# Patient Record
Sex: Female | Born: 1980 | Race: White | Hispanic: No | Marital: Married | Smoking: Never smoker
Health system: Southern US, Community
[De-identification: ages and names within clinical notes are randomized; demographics above are authoritative.]

## PROBLEM LIST (undated history)

## (undated) DIAGNOSIS — H8109 Meniere's disease, unspecified ear: Secondary | ICD-10-CM

## (undated) HISTORY — PX: CARPAL TUNNEL RELEASE: SHX101

## (undated) HISTORY — PX: ROTATOR CUFF REPAIR: SHX139

## (undated) HISTORY — PX: ANKLE SURGERY: SHX546

---

## 2019-02-05 DIAGNOSIS — H8109 Meniere's disease, unspecified ear: Secondary | ICD-10-CM | POA: Insufficient documentation

## 2020-03-07 DIAGNOSIS — F419 Anxiety disorder, unspecified: Secondary | ICD-10-CM | POA: Insufficient documentation

## 2020-10-31 DIAGNOSIS — J302 Other seasonal allergic rhinitis: Secondary | ICD-10-CM | POA: Insufficient documentation

## 2020-12-02 ENCOUNTER — Other Ambulatory Visit: Payer: Self-pay

## 2020-12-02 DIAGNOSIS — R197 Diarrhea, unspecified: Secondary | ICD-10-CM | POA: Insufficient documentation

## 2020-12-02 DIAGNOSIS — R1031 Right lower quadrant pain: Secondary | ICD-10-CM | POA: Insufficient documentation

## 2020-12-02 NOTE — ED Triage Notes (Signed)
Pt reports intermittent lower abdominal pain x2 days and diarrhea that started today. Pt denies N/V. Pt reports pain has become progressively worse.

## 2020-12-03 ENCOUNTER — Other Ambulatory Visit: Payer: Self-pay

## 2020-12-03 ENCOUNTER — Encounter (HOSPITAL_COMMUNITY): Payer: Self-pay

## 2020-12-03 ENCOUNTER — Emergency Department (HOSPITAL_COMMUNITY)
Admission: EM | Admit: 2020-12-03 | Discharge: 2020-12-03 | Disposition: A | Discharge status: Home / Self Care | Payer: BC Managed Care – PPO | Attending: Emergency Medicine | Admitting: Emergency Medicine

## 2020-12-03 ENCOUNTER — Emergency Department (HOSPITAL_COMMUNITY): Payer: BC Managed Care – PPO

## 2020-12-03 DIAGNOSIS — R1031 Right lower quadrant pain: Secondary | ICD-10-CM

## 2020-12-03 DIAGNOSIS — R197 Diarrhea, unspecified: Secondary | ICD-10-CM

## 2020-12-03 HISTORY — DX: Meniere's disease, unspecified ear: H81.09

## 2020-12-03 LAB — CBC
HCT: 39.7 % (ref 36.0–46.0)
Hemoglobin: 13 g/dL (ref 12.0–15.0)
MCH: 27.6 pg (ref 26.0–34.0)
MCHC: 32.7 g/dL (ref 30.0–36.0)
MCV: 84.3 fL (ref 80.0–100.0)
Platelets: 219 10*3/uL (ref 150–400)
RBC: 4.71 MIL/uL (ref 3.87–5.11)
RDW: 12.1 % (ref 11.5–15.5)
WBC: 4.7 10*3/uL (ref 4.0–10.5)
nRBC: 0 % (ref 0.0–0.2)

## 2020-12-03 LAB — URINALYSIS, ROUTINE W REFLEX MICROSCOPIC
Bilirubin Urine: NEGATIVE
Glucose, UA: NEGATIVE mg/dL
Ketones, ur: NEGATIVE mg/dL
Leukocytes,Ua: NEGATIVE
Nitrite: NEGATIVE
Protein, ur: NEGATIVE mg/dL
Specific Gravity, Urine: 1.017 (ref 1.005–1.030)
pH: 6 (ref 5.0–8.0)

## 2020-12-03 LAB — COMPREHENSIVE METABOLIC PANEL
ALT: 25 U/L (ref 0–44)
AST: 40 U/L (ref 15–41)
Albumin: 4 g/dL (ref 3.5–5.0)
Alkaline Phosphatase: 65 U/L (ref 38–126)
Anion gap: 10 (ref 5–15)
BUN: 13 mg/dL (ref 6–20)
CO2: 21 mmol/L — ABNORMAL LOW (ref 22–32)
Calcium: 8.6 mg/dL — ABNORMAL LOW (ref 8.9–10.3)
Chloride: 103 mmol/L (ref 98–111)
Creatinine, Ser: 1.01 mg/dL — ABNORMAL HIGH (ref 0.44–1.00)
GFR, Estimated: >60 mL/min (ref >60)
Glucose, Bld: 111 mg/dL — ABNORMAL HIGH (ref 70–99)
Potassium: 5 mmol/L (ref 3.5–5.1)
Sodium: 134 mmol/L — ABNORMAL LOW (ref 135–145)
Total Bilirubin: 0.9 mg/dL (ref 0.3–1.2)
Total Protein: 7.5 g/dL (ref 6.5–8.1)

## 2020-12-03 LAB — LIPASE, BLOOD: Lipase: 34 U/L (ref 11–51)

## 2020-12-03 LAB — I-STAT BETA HCG BLOOD, ED (MC, WL, AP ONLY): I-stat hCG, quantitative: <5 m[IU]/mL (ref <5)

## 2020-12-03 MED ORDER — IOHEXOL 300 MG/ML  SOLN
100.0000 mL | Freq: Once | INTRAMUSCULAR | Status: AC | PRN
  Administered 2020-12-03: 100 mL via INTRAVENOUS

## 2020-12-03 MED ORDER — SODIUM CHLORIDE (PF) 0.9 % IJ SOLN
INTRAMUSCULAR | Status: AC
  Filled 2020-12-03: qty 50

## 2020-12-03 MED ORDER — ONDANSETRON HCL 4 MG/2ML IJ SOLN
4.0000 mg | Freq: Once | INTRAMUSCULAR | Status: AC
  Administered 2020-12-03: 4 mg via INTRAVENOUS
  Filled 2020-12-03: qty 2

## 2020-12-03 MED ORDER — HYDROMORPHONE HCL 1 MG/ML IJ SOLN
0.5000 mg | Freq: Once | INTRAMUSCULAR | Status: AC
  Administered 2020-12-03: 0.5 mg via INTRAVENOUS
  Filled 2020-12-03: qty 1

## 2020-12-03 NOTE — Discharge Instructions (Addendum)
Please follow up with your doctor for recheck if symptoms persist through the weekend.   Return to the emergency department with new or worsening symptoms. Zofran for nausea as needed. Tylenol and/or ibuprofen for pain.

## 2020-12-03 NOTE — ED Provider Notes (Signed)
[facility]-EMERGENCY DEPT Provider Note   CSN: [id number] Arrival date & time: 12/02/20  2343     History Chief Complaint  Patient presents with  . Abdominal Pain    Diana Macdonald is a 39 y.o. female.  Patient with progressively worsening abdominal pain in the RLQ x 2 days. No fever, nausea or vomiting. No urinary symptoms. She reports the pain became significantly worse tonight prompting ED evaluation. No vaginal bleeding, discharge or irregular periods. No history of ovarian cysts.   The history is provided by the patient. No language interpreter was used.  Abdominal Pain Pain location:  RLQ Pain radiates to:  Does not radiate Pain severity:  Severe Onset quality:  Gradual Duration:  2 days Timing:  Constant Progression:  Worsening Chronicity:  New Relieved by:  Nothing Worsened by:  Nothing Associated symptoms: diarrhea   Associated symptoms: no chills, no dysuria, no fever, no nausea, no vaginal bleeding and no vomiting        Past Medical History:  Diagnosis Date  . Meniere disease     There are no problems to display for this patient.   History reviewed. No pertinent surgical history.   OB History   No obstetric history on file.     History reviewed. No pertinent family history.  Social History   Tobacco Use  . Smoking status: Not on file  Substance Use Topics  . Alcohol use: Not on file  . Drug use: Not on file    Home Medications Prior to Admission medications   Medication Sig Start Date End Date Taking? Authorizing Provider  cetirizine (ZYRTEC) 10 MG tablet Take 1 tablet by mouth daily as needed.   Yes [provider]  diazepam (VALIUM) 2 MG tablet Take 2 mg by mouth 2 (two) times daily as needed. 06/10/20  Yes [provider]  escitalopram (LEXAPRO) 10 MG tablet Take 10 mg by mouth daily. 10/20/20  Yes [provider]  Olopatadine HCl 0.2 % SOLN Place 1 drop into both eyes daily.    Yes [provider]    Allergies    Codeine and Gramineae pollens  Review of Systems   Review of Systems  Constitutional: Negative for chills and fever.  HENT: Negative.   Respiratory: Negative.   Cardiovascular: Negative.   Gastrointestinal: Positive for abdominal pain and diarrhea. Negative for nausea and vomiting.  Genitourinary: Negative for dysuria, flank pain, pelvic pain and vaginal bleeding.  Musculoskeletal: Negative.   Skin: Negative.   Neurological: Negative.     Physical Exam Updated Vital Signs BP 105/69   Pulse 84   Temp 98.5 F (36.9 C) (Oral)   Resp 16   Ht 5\' 4"  (1.626 m)   Wt 79.4 kg   LMP 11/25/2020 (Exact Date)   SpO2 96%   BMI 30.04 kg/m   Physical Exam Vitals and nursing note reviewed.  Constitutional:      General: She is not in acute distress.    Appearance: She is well-developed.  HENT:     Head: Normocephalic.  Cardiovascular:     Rate and Rhythm: Normal rate and regular rhythm.  Pulmonary:     Effort: Pulmonary effort is normal.     Breath sounds: Normal breath sounds.  Abdominal:     General: Bowel sounds are normal.     Palpations: Abdomen is soft.     Tenderness: There is abdominal tenderness in the right lower quadrant. There is rebound. There is no guarding.  Musculoskeletal:        General: Normal range of motion.     Cervical back: Normal range of motion and neck supple.  Skin:    General: Skin is warm and dry.     Findings: No rash.  Neurological:     Mental Status: She is alert.     Cranial Nerves: No cranial nerve deficit.     ED Results / Procedures / Treatments   Labs (all labs ordered are listed, but only abnormal results are displayed) Labs Reviewed  COMPREHENSIVE METABOLIC PANEL - Abnormal; Notable for the following components:      Result Value   Sodium 134 (*)    CO2 21 (*)    Glucose, Bld 111 (*)    Creatinine, Ser 1.01 (*)    Calcium 8.6 (*)    All other components within normal limits   URINALYSIS, ROUTINE W REFLEX MICROSCOPIC - Abnormal; Notable for the following components:   Hgb urine dipstick MODERATE (*)    Bacteria, UA RARE (*)    All other components within normal limits  LIPASE, BLOOD  CBC  I-STAT BETA HCG BLOOD, ED ([facility], [facility], [facility] ONLY)   Results for orders placed or performed during the hospital encounter of 12/03/20  Lipase, blood  Result Value Ref Range   Lipase 34 11 - 51 U/L  Comprehensive metabolic panel  Result Value Ref Range   Sodium 134 (L) 135 - 145 mmol/L   Potassium 5.0 3.5 - 5.1 mmol/L   Chloride 103 98 - 111 mmol/L   CO2 21 (L) 22 - 32 mmol/L   Glucose, Bld 111 (H) 70 - 99 mg/dL   BUN 13 6 - 20 mg/dL   Creatinine, Ser 2.99 (H) 0.44 - 1.00 mg/dL   Calcium 8.6 (L) 8.9 - 10.3 mg/dL   Total Protein 7.5 6.5 - 8.1 g/dL   Albumin 4.0 3.5 - 5.0 g/dL   AST 40 15 - 41 U/L   ALT 25 0 - 44 U/L   Alkaline Phosphatase 65 38 - 126 U/L   Total Bilirubin 0.9 0.3 - 1.2 mg/dL   GFR, Estimated >37 >16 mL/min   Anion gap 10 5 - 15  CBC  Result Value Ref Range   WBC 4.7 4.0 - 10.5 K/uL   RBC 4.71 3.87 - 5.11 MIL/uL   Hemoglobin 13.0 12.0 - 15.0 g/dL   HCT 96.7 36 - 46 %   MCV 84.3 80.0 - 100.0 fL   MCH 27.6 26.0 - 34.0 pg   MCHC 32.7 30.0 - 36.0 g/dL   RDW 89.3 81.0 - 17.5 %   Platelets 219 150 - 400 K/uL   nRBC 0.0 0.0 - 0.2 %  Urinalysis, Routine w reflex microscopic Urine, Catheterized  Result Value Ref Range   Color, Urine YELLOW YELLOW   APPearance CLEAR CLEAR   Specific Gravity, Urine 1.017 1.005 - 1.030   pH 6.0 5.0 - 8.0   Glucose, UA NEGATIVE NEGATIVE mg/dL   Hgb urine dipstick MODERATE (A) NEGATIVE   Bilirubin Urine NEGATIVE NEGATIVE   Ketones, ur NEGATIVE NEGATIVE mg/dL   Protein, ur NEGATIVE NEGATIVE mg/dL   Nitrite NEGATIVE NEGATIVE   Leukocytes,Ua NEGATIVE NEGATIVE   RBC / HPF 0-5 0 - 5 RBC/hpf   WBC, UA 0-5 0 - 5 WBC/hpf   Bacteria, UA RARE (A) NONE SEEN   Squamous Epithelial / LPF 0-5 0 - 5   Mucus PRESENT   I-Stat beta hCG  blood, ED  Result  Value Ref Range   I-stat hCG, quantitative <5.0 <5 mIU/mL   Comment 3           EKG None  Radiology CT ABDOMEN PELVIS W CONTRAST  Addendum Date: 12/03/2020   ADDENDUM REPORT: 12/03/2020 04:51 ADDENDUM: Clustered 3-4 mm nodules in the right middle lobe. No follow-up needed if patient is low-risk (and has no known or suspected primary neoplasm). Non-contrast chest CT can be considered in 12 months if patient is high-risk. This recommendation follows the consensus statement: Guidelines for Management of Incidental Pulmonary Nodules Detected on CT Images: From the Fleischner Society 2017; Radiology 2017; 284:228-243. Electronically Signed   By: Deatra RobinsonKevin  Herman M.D.   On: 12/03/2020 04:51   Result Date: 12/03/2020 CLINICAL DATA:  Right lower quadrant pain EXAM: CT ABDOMEN AND PELVIS WITH CONTRAST TECHNIQUE: Multidetector CT imaging of the abdomen and pelvis was performed using the standard protocol following bolus administration of intravenous contrast. CONTRAST:  100mL OMNIPAQUE IOHEXOL 300 MG/ML  SOLN COMPARISON:  None. FINDINGS: LOWER CHEST: Normal. HEPATOBILIARY: Normal hepatic contours. No intra- or extrahepatic biliary dilatation. The gallbladder is normal. PANCREAS: Normal pancreas. No ductal dilatation or peripancreatic fluid collection. SPLEEN: Normal. ADRENALS/URINARY TRACT: The adrenal glands are normal. No hydronephrosis, nephroureterolithiasis or solid renal mass. The urinary bladder is normal for degree of distention STOMACH/BOWEL: There is no hiatal hernia. Normal duodenal course and caliber. No small bowel dilatation or inflammation. No focal colonic abnormality. Normal appendix. VASCULAR/LYMPHATIC: Normal course and caliber of the major abdominal vessels. No abdominal or pelvic lymphadenopathy. REPRODUCTIVE: Normal uterus. No adnexal mass. MUSCULOSKELETAL. No bony spinal canal stenosis or focal osseous abnormality. OTHER: None. IMPRESSION: No acute abnormality of the abdomen  or pelvis. Electronically Signed: By: Deatra RobinsonKevin  Herman M.D. On: 12/03/2020 04:44    Procedures Procedures (including critical care time)  Medications Ordered in ED Medications  sodium chloride (PF) 0.9 % injection (has no administration in time range)  HYDROmorphone (DILAUDID) injection 0.5 mg (0.5 mg Intravenous Given 12/03/20 0345)  ondansetron (ZOFRAN) injection 4 mg (4 mg Intravenous Given 12/03/20 0410)  iohexol (OMNIPAQUE) 300 MG/ML solution 100 mL (100 mLs Intravenous Contrast Given 12/03/20 0416)    ED Course  I have reviewed the triage vital signs and the nursing notes.  Pertinent labs & imaging results that were available during my care of the patient were reviewed by me and considered in my medical decision making (see chart for details).    MDM Rules/Calculators/A&P                          Patient to ED for evaluation of abdominal pain as detailed in the HPI. Diarrhea but no vomiting. No fever.   Abdomen is tender in the RLQ with rebound. No fever, no leukocytosis, UA negative. Discussed a pelvic exam with the patient to evaluate right adnexal area and to determine the best imaging modality - CT vs pelvic US. The patient defers gynecologic exam and favors CT to evaluate RLQ abdominal pain.   CT visualizes a normal appendix, no adnexal masses. There is an incidental finding of RML lung nodules that was discussed with the patient, who is felt to be low risk.   Recheck and re-exam: The patient received 0.5 mg Dilaudid and reports her pain is significantly improved. She is minimally tender to palpation.   Case reviewed and discussed with Dr. Bebe ShaggyWickline. She can be discharged home. Recommended Zofran prn for nausea, Tylenol and/or ibuprofen for pain. Strict return  precautions discussed - specifically, severe pain, bloody stools, high fever, onset uncontrolled vomiting.   Final Clinical Impression(s) / ED Diagnoses Final diagnoses:  None   1. Abdominal pain 2. Diarrhea  Rx / DC  Orders ED Discharge Orders    None       Elpidio Anis, PA-C 12/03/20 1941    Zadie Rhine, MD 12/03/20 [phone]

## 2021-12-23 IMAGING — CT CT ABD-PELV W/ CM
2 of 4 series · 15 of 46 positions shown, 17 images · IV contrast (omnipaque)
Comparison: None.
COMPARISON: None.

Addendum:
CLINICAL DATA: Right lower quadrant pain

EXAM:
CT ABDOMEN AND PELVIS WITH CONTRAST
TECHNIQUE: Multidetector CT imaging of the abdomen and pelvis was performed
using the standard protocol following bolus administration of
intravenous contrast.
CONTRAST:  100mL OMNIPAQUE IOHEXOL 300 MG/ML  SOLN

[Series 2: axial st · axial · 0.76mm/px · z∈[-520,-90]mm · 12 of 98 slices shown, 14 images]
[im 6/98  soft-tissue]
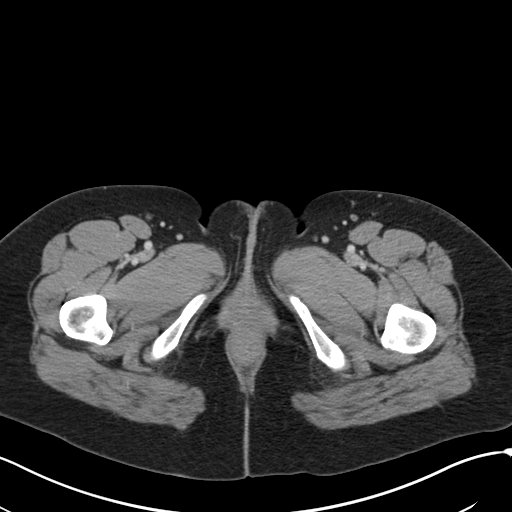
[im 6/98  bone]
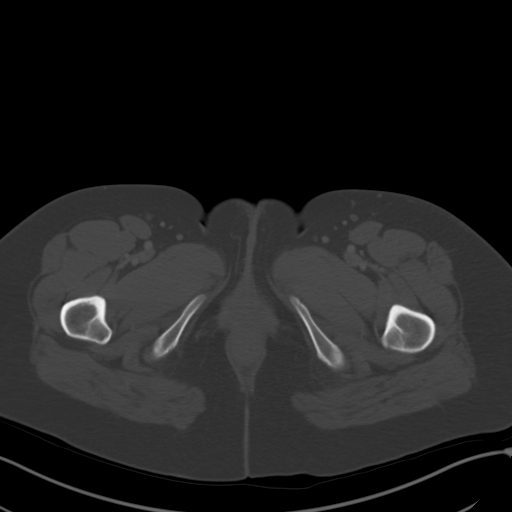
[im 16/98  soft-tissue]
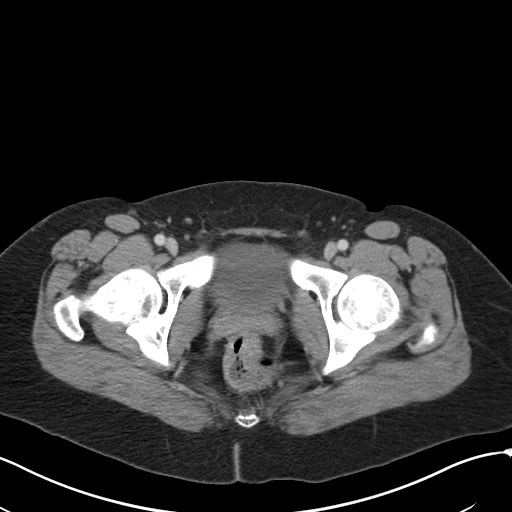
[im 21/98  soft-tissue]
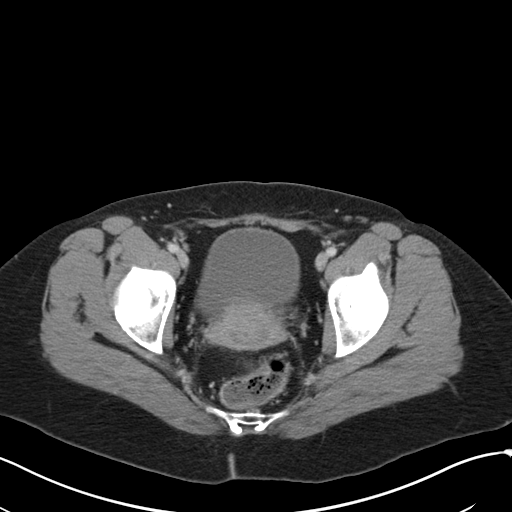
[im 31/98  soft-tissue]
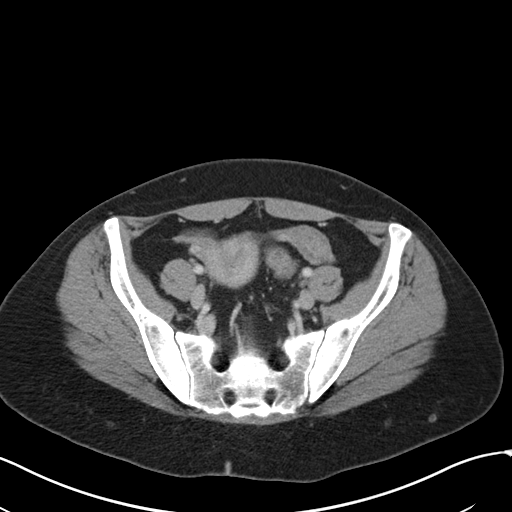
[im 36/98  soft-tissue]
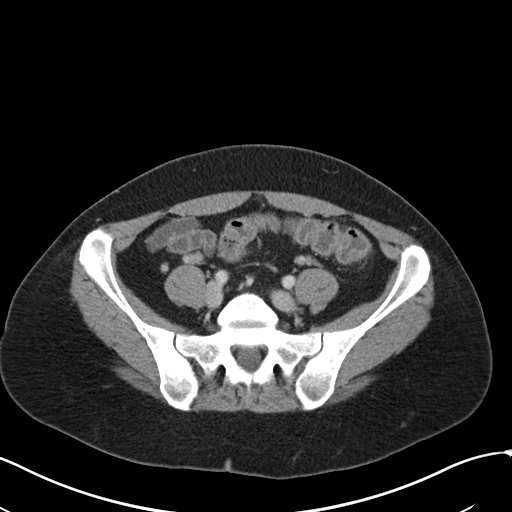
[im 46/98  soft-tissue]
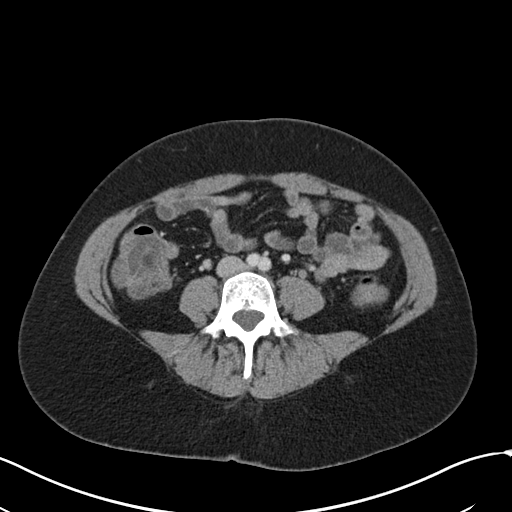
[im 52/98  soft-tissue]
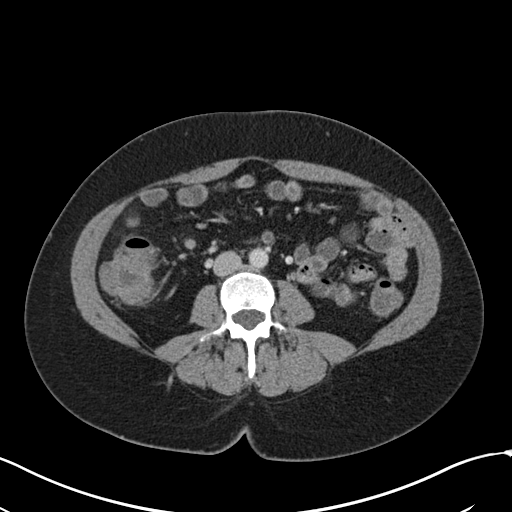
[im 62/98  soft-tissue]
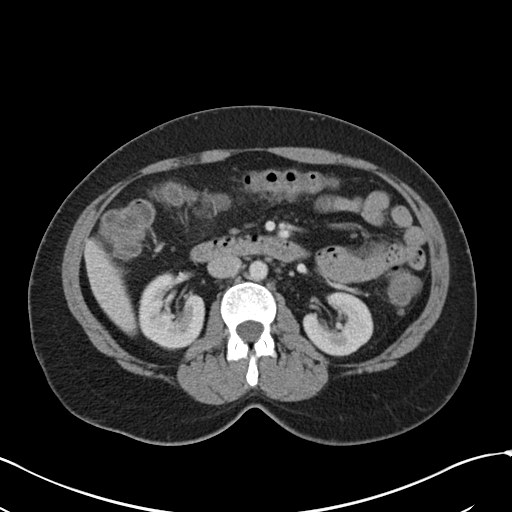
[im 67/98  soft-tissue]
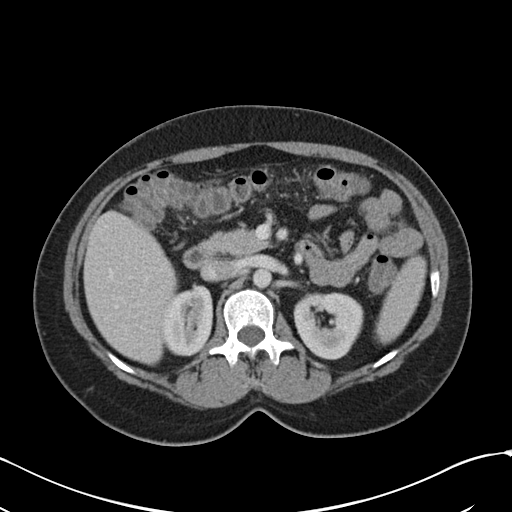
[im 67/98  bone]
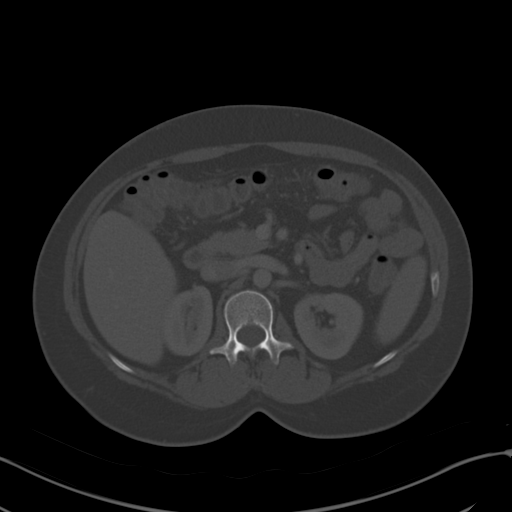
[im 77/98  soft-tissue]
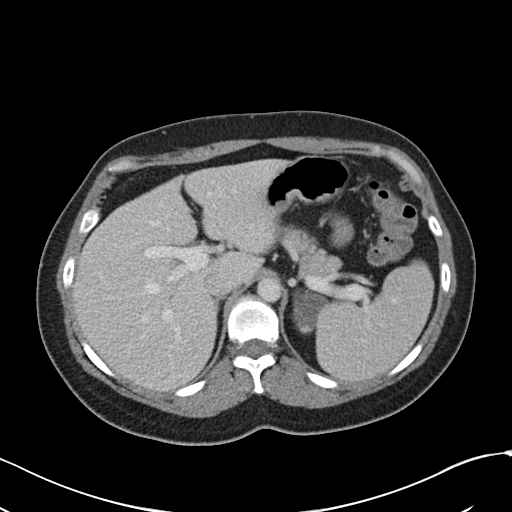
[im 82/98  soft-tissue]
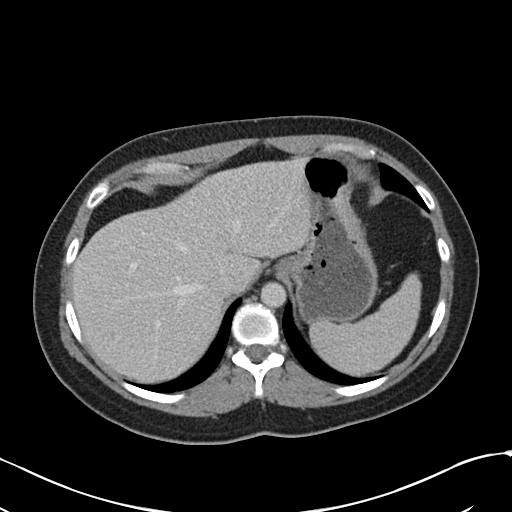
[im 92/98  soft-tissue]
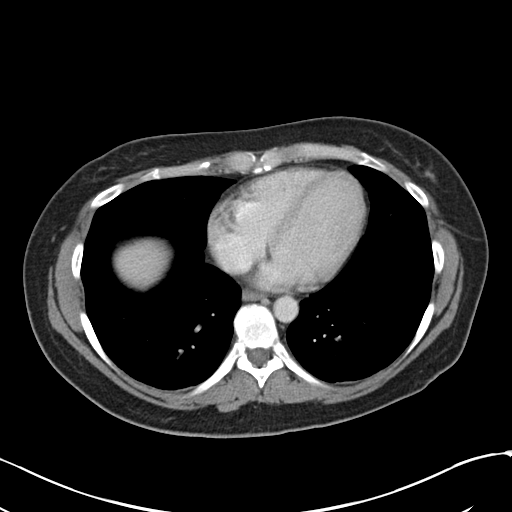

[Series 5: coronal st · coronal · 0.77mm/px · 3 of 151 slices shown]
[im 51/151  soft-tissue]
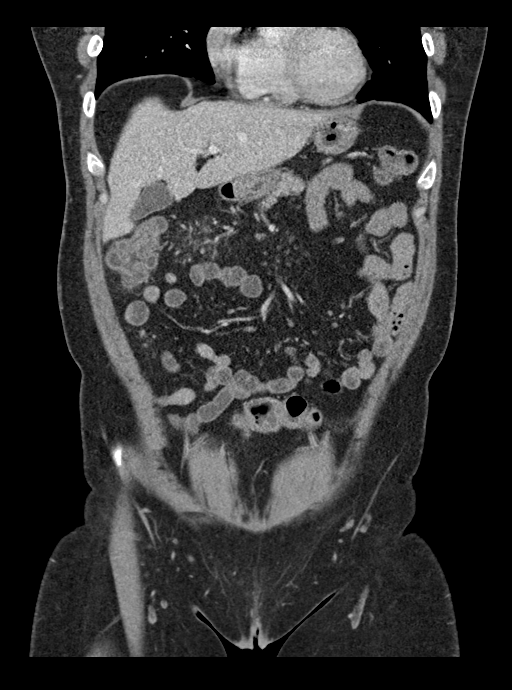
[im 67/151  soft-tissue]
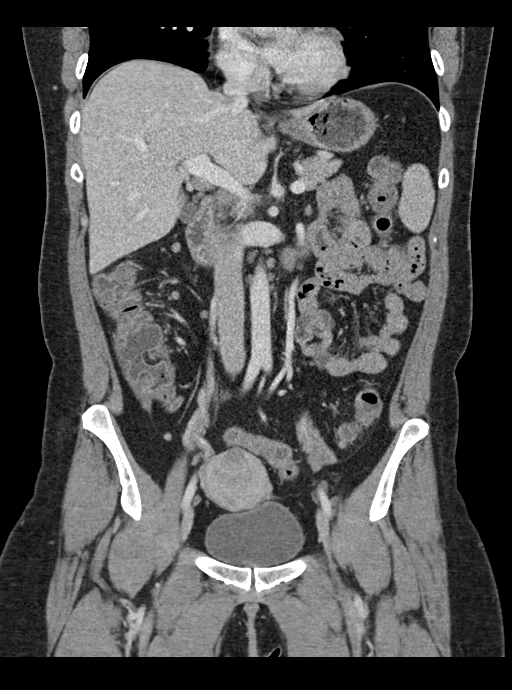
[im 84/151  soft-tissue]
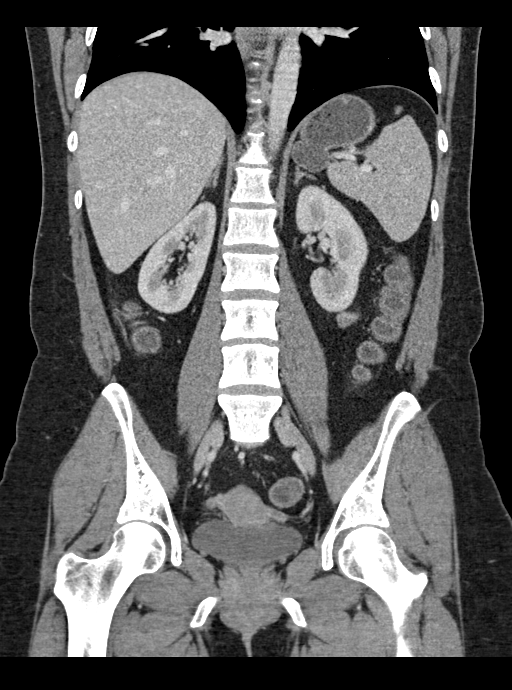

[15 of 46 positions shown; findings below may reference images not displayed]

FINDINGS: LOWER CHEST: Normal.

HEPATOBILIARY: Normal hepatic contours. No intra- or extrahepatic
biliary dilatation. The gallbladder is normal.

PANCREAS: Normal pancreas. No ductal dilatation or peripancreatic
fluid collection.

SPLEEN: Normal.

ADRENALS/URINARY TRACT: The adrenal glands are normal. No
hydronephrosis, nephroureterolithiasis or solid renal mass. The
urinary bladder is normal for degree of distention

STOMACH/BOWEL: There is no hiatal hernia. Normal duodenal course and
caliber. No small bowel dilatation or inflammation. No focal colonic
abnormality. Normal appendix.

VASCULAR/LYMPHATIC: Normal course and caliber of the major abdominal
vessels. No abdominal or pelvic lymphadenopathy.

REPRODUCTIVE: Normal uterus. No adnexal mass.

MUSCULOSKELETAL. No bony spinal canal stenosis or focal osseous
abnormality.

OTHER: None.
IMPRESSION: No acute abnormality of the abdomen or pelvis.

ADDENDUM:
Clustered 3-4 mm nodules in the right middle lobe. No follow-up
needed if patient is low-risk (and has no known or suspected primary
neoplasm). Non-contrast chest CT can be considered in 12 months if
patient is high-risk. This recommendation follows the consensus
statement: Guidelines for Management of Incidental Pulmonary Nodules
Detected on CT Images: From the [HOSPITAL] 3366; Radiology
3366; [DATE].

*** End of Addendum ***
FINDINGS: LOWER CHEST: Normal.

HEPATOBILIARY: Normal hepatic contours. No intra- or extrahepatic
biliary dilatation. The gallbladder is normal.

PANCREAS: Normal pancreas. No ductal dilatation or peripancreatic
fluid collection.

SPLEEN: Normal.

ADRENALS/URINARY TRACT: The adrenal glands are normal. No
hydronephrosis, nephroureterolithiasis or solid renal mass. The
urinary bladder is normal for degree of distention

STOMACH/BOWEL: There is no hiatal hernia. Normal duodenal course and
caliber. No small bowel dilatation or inflammation. No focal colonic
abnormality. Normal appendix.

VASCULAR/LYMPHATIC: Normal course and caliber of the major abdominal
vessels. No abdominal or pelvic lymphadenopathy.

REPRODUCTIVE: Normal uterus. No adnexal mass.

MUSCULOSKELETAL. No bony spinal canal stenosis or focal osseous
abnormality.

OTHER: None.
IMPRESSION: No acute abnormality of the abdomen or pelvis.

## 2022-06-07 ENCOUNTER — Encounter (HOSPITAL_BASED_OUTPATIENT_CLINIC_OR_DEPARTMENT_OTHER): Payer: Self-pay

## 2022-06-07 ENCOUNTER — Other Ambulatory Visit: Payer: Self-pay

## 2022-06-07 ENCOUNTER — Emergency Department (HOSPITAL_BASED_OUTPATIENT_CLINIC_OR_DEPARTMENT_OTHER)
Admission: EM | Admit: 2022-06-07 | Discharge: 2022-06-07 | Disposition: A | Discharge status: Home / Self Care | Payer: Managed Care, Other (non HMO) | Attending: Emergency Medicine | Admitting: Emergency Medicine

## 2022-06-07 DIAGNOSIS — Z20822 Contact with and (suspected) exposure to covid-19: Secondary | ICD-10-CM | POA: Diagnosis not present

## 2022-06-07 DIAGNOSIS — R509 Fever, unspecified: Secondary | ICD-10-CM | POA: Insufficient documentation

## 2022-06-07 LAB — SARS CORONAVIRUS 2 BY RT PCR: SARS Coronavirus 2 by RT PCR: NEGATIVE

## 2022-06-07 MED ORDER — NAPROXEN 500 MG PO TABS: 500.0000 mg | ORAL_TABLET | Freq: Two times a day (BID) | ORAL | 0 refills | Status: DC

## 2022-06-07 NOTE — Discharge Instructions (Addendum)
You were evaluated in the Emergency Department and after careful evaluation, we did not find any emergent condition requiring admission or further testing in the hospital.  Your exam/testing today is overall reassuring.  Suspect fevers due to a viral illness.  Can use the Naprosyn for body aches, fever as needed.  Recommend plenty of fluids and rest.  Follow-up on your COVID test tomorrow morning.  Please return to the Emergency Department if you experience any worsening of your condition.   Thank you for allowing [place] to be a part of your care.

## 2022-06-07 NOTE — ED Triage Notes (Signed)
Pt arrives POV with complaints of fever since yesterday afternoon. Pt states that she has been taking tylenol with some relief. Pt took Tylenol an hour and a half PTA. Denies pain at this time.

## 2022-06-07 NOTE — ED Provider Notes (Signed)
[facility] EMERGENCY [facility] Emergency Department Provider Note MRN:  [id number]  Arrival date & time: 06/07/22     Chief Complaint   Fever   History of Present Illness   Diana Macdonald is a 41 y.o. year-old female with no pertinent past medical history presenting to the ED with chief complaint of fever.  Fever for the past 24 hours, up to 102 at home.  Does not normally get fevers and so here for evaluation.  Feeling some body aches but otherwise no symptoms.  No rash, no tick bites, no chest pain or shortness of breath, no cough or cold-like symptoms, no vomiting or diarrhea, no abdominal pain.  Review of Systems  A thorough review of systems was obtained and all systems are negative except as noted in the HPI and PMH.   Patient's Health History    Past Medical History:  Diagnosis Date   Meniere disease     No past surgical history on file.  No family history on file.  Social History   Socioeconomic History   Marital status: Married    Spouse name: Not on file   Number of children: Not on file   Years of education: Not on file   Highest education level: Not on file  Occupational History   Not on file  Tobacco Use   Smoking status: Not on file   Smokeless tobacco: Not on file  Substance and Sexual Activity   Alcohol use: Not on file   Drug use: Not on file   Sexual activity: Not on file  Other Topics Concern   Not on file  Social History Narrative   Not on file   Social Determinants of Health   Financial Resource Strain: Not on file  Food Insecurity: Not on file  Transportation Needs: Not on file  Physical Activity: Not on file  Stress: Not on file  Social Connections: Not on file  Intimate Partner Violence: Not on file     Physical Exam   Vitals:   06/07/22 1922 06/07/22 2230  BP: (!) 135/99 117/79  Pulse: (!) 120 92  Resp: 18 16  Temp: 100.3 F (37.9 C) (!) 100.6 F (38.1 C)  SpO2: 97% 96%    CONSTITUTIONAL: Well-appearing,  NAD NEURO/PSYCH:  Alert and oriented x 3, no focal deficits EYES:  eyes equal and reactive ENT/NECK:  no LAD, no JVD CARDIO: Regular rate, well-perfused, normal S1 and S2 PULM:  CTAB no wheezing or rhonchi GI/GU:  non-distended, non-tender MSK/SPINE:  No gross deformities, no edema SKIN:  no rash, atraumatic   *Additional and/or pertinent findings included in MDM below  Diagnostic and Interventional Summary    EKG Interpretation  Date/Time:    Ventricular Rate:    PR Interval:    QRS Duration:   QT Interval:    QTC Calculation:   R Axis:     Text Interpretation:         Labs Reviewed  SARS CORONAVIRUS 2 BY RT PCR    No orders to display    Medications - No data to display   Procedures  /  Critical Care Procedures  ED Course and Medical Decision Making  Initial Impression and Ddx Fever of unclear source, suspect viral given patient's very well-appearing nature.  Normal vital signs, no meningismus, abdomen is soft and nontender, she has no pain.  Provided reassurance.  Past medical/surgical history that increases complexity of ED encounter: None  Interpretation of Diagnostics Not applicable  Patient Reassessment  and Ultimate Disposition/Management     Discharge  Patient management required discussion with the following services or consulting groups:  None  Complexity of Problems Addressed Acute complicated illness or Injury  Additional Data Reviewed and Analyzed Further history obtained from: None  Additional Factors Impacting ED Encounter Risk Prescriptions  Elmer Sow. Pilar Plate, MD [facility] Emergency Medicine [facility] [email or web address]  Final Clinical Impressions(s) / ED Diagnoses     ICD-10-CM   1. Fever, unspecified fever cause  R50.9       ED Discharge Orders          Ordered    naproxen (NAPROSYN) 500 MG tablet  2 times daily        06/07/22 2335             Discharge Instructions Discussed with and  Provided to Patient:    Discharge Instructions      You were evaluated in the Emergency Department and after careful evaluation, we did not find any emergent condition requiring admission or further testing in the hospital.  Your exam/testing today is overall reassuring.  Suspect fevers due to a viral illness.  Can use the Naprosyn for body aches, fever as needed.  Recommend plenty of fluids and rest.  Follow-up on your COVID test tomorrow morning.  Please return to the Emergency Department if you experience any worsening of your condition.   Thank you for allowing [place] to be a part of your care.      Sabas Sous, MD 06/07/22 2337

## 2022-08-30 DIAGNOSIS — G5603 Carpal tunnel syndrome, bilateral upper limbs: Secondary | ICD-10-CM | POA: Insufficient documentation

## 2024-08-17 LAB — LAB REPORT - SCANNED: EGFR: 82

## 2024-08-25 DIAGNOSIS — K8681 Exocrine pancreatic insufficiency: Secondary | ICD-10-CM | POA: Insufficient documentation

## 2024-09-11 ENCOUNTER — Other Ambulatory Visit: Payer: Self-pay | Admitting: Medical Genetics

## 2024-09-15 ENCOUNTER — Ambulatory Visit: Admitting: Student in an Organized Health Care Education/Training Program

## 2024-09-15 VITALS — BP 112/77 | HR 74 | Ht 64.0 in | Wt 170.2 lb

## 2024-09-15 DIAGNOSIS — L680 Hirsutism: Secondary | ICD-10-CM

## 2024-09-15 DIAGNOSIS — E538 Deficiency of other specified B group vitamins: Secondary | ICD-10-CM

## 2024-09-15 DIAGNOSIS — R634 Abnormal weight loss: Secondary | ICD-10-CM

## 2024-09-15 DIAGNOSIS — E611 Iron deficiency: Secondary | ICD-10-CM | POA: Diagnosis not present

## 2024-09-15 DIAGNOSIS — K8681 Exocrine pancreatic insufficiency: Secondary | ICD-10-CM

## 2024-09-15 LAB — CBC WITH DIFFERENTIAL/PLATELET
Basophils Absolute: 0 K/uL (ref 0.0–0.1)
Basophils Relative: 0.4 % (ref 0.0–3.0)
Eosinophils Absolute: 0.1 K/uL (ref 0.0–0.7)
Eosinophils Relative: 2 % (ref 0.0–5.0)
HCT: 40.3 % (ref 36.0–46.0)
Hemoglobin: 13.4 g/dL (ref 12.0–15.0)
Lymphocytes Relative: 21.2 % (ref 12.0–46.0)
Lymphs Abs: 1.2 K/uL (ref 0.7–4.0)
MCHC: 33.2 g/dL (ref 30.0–36.0)
MCV: 84.9 fl (ref 78.0–100.0)
Monocytes Absolute: 0.4 K/uL (ref 0.1–1.0)
Monocytes Relative: 6.3 % (ref 3.0–12.0)
Neutro Abs: 4 K/uL (ref 1.4–7.7)
Neutrophils Relative %: 70.1 % (ref 43.0–77.0)
Platelets: 195 K/uL (ref 150.0–400.0)
RBC: 4.74 Mil/uL (ref 3.87–5.11)
RDW: 13 % (ref 11.5–15.5)
WBC: 5.7 K/uL (ref 4.0–10.5)

## 2024-09-15 LAB — COMPREHENSIVE METABOLIC PANEL WITH GFR
ALT: 7 U/L (ref 0–35)
AST: 10 U/L (ref 0–37)
Albumin: 4.3 g/dL (ref 3.5–5.2)
Alkaline Phosphatase: 49 U/L (ref 39–117)
BUN: 12 mg/dL (ref 6–23)
CO2: 26 meq/L (ref 19–32)
Calcium: 9.1 mg/dL (ref 8.4–10.5)
Chloride: 108 meq/L (ref 96–112)
Creatinine, Ser: 0.9 mg/dL (ref 0.40–1.20)
GFR: 79.28 mL/min (ref >60.00)
Glucose, Bld: 97 mg/dL (ref 70–99)
Potassium: 4.1 meq/L (ref 3.5–5.1)
Sodium: 135 meq/L (ref 135–145)
Total Bilirubin: 0.5 mg/dL (ref 0.2–1.2)
Total Protein: 6.7 g/dL (ref 6.0–8.3)

## 2024-09-15 LAB — IBC + FERRITIN
Ferritin: 19.4 ng/mL (ref 10.0–291.0)
Iron: 45 ug/dL (ref 42–145)
Saturation Ratios: 12.4 % — ABNORMAL LOW (ref 20.0–50.0)
TIBC: 364 ug/dL (ref 250.0–450.0)
Transferrin: 260 mg/dL (ref 212.0–360.0)

## 2024-09-15 LAB — VITAMIN B12: Vitamin B-12: 193 pg/mL — ABNORMAL LOW (ref 211–911)

## 2024-09-15 LAB — POCT URINE PREGNANCY: Preg Test, Ur: NEGATIVE

## 2024-09-15 LAB — HEMOGLOBIN A1C: Hgb A1c MFr Bld: 5.7 % (ref 4.6–6.5)

## 2024-09-15 LAB — TSH: TSH: 1.21 u[IU]/mL (ref 0.35–5.50)

## 2024-09-15 LAB — T4, FREE: Free T4: 1.02 ng/dL (ref 0.60–1.60)

## 2024-09-15 NOTE — Assessment & Plan Note (Signed)
 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  She has been using 3 tablets of ferrous sulfate on a daily basis for many years and based on the labs from August she is still iron deficient and symptomatic.  We talked about doing an iron infusion which she is agreeable.  I have ordered for 2 doses of Feraheme at the [facility] infusion center.  I recommended reducing the oral iron to just 1 tablet daily in order to improve absorption and reduce herceptin activity.

## 2024-09-15 NOTE — Assessment & Plan Note (Signed)
 Exam shows some symptoms of hyperandrogenism that are chronic.  No history of PCOS.  Her birth history has 1 miscarriage of twins, 2 other pregnancies.  Will check a 17-hydroxyprogesterone today to start evaluation for congenital adrenal hyperplasia.

## 2024-09-15 NOTE — Assessment & Plan Note (Signed)
 20 pound weight loss history over the last 6 months unintentionally.  This is in the setting of multiple vitamin deficiencies including iron and B12 as well as history of pancreatic exocrine insufficiency of unclear etiology.  Weight loss certainly could be coming from a worsening of her GI absorption.  Celiac's disease reportedly ruled out with serology testing.  Risk of an underlying malignancy seems low given her young age and reassuring exam.  The combination of weight loss, carpal tunnel, and pancreatic deficiency raises the risk of an infiltrative disorder like AL amyloidosis, hemochromatosis seems less likely given her comorbid iron deficiency.  Will start by checking labs today to rule out some of these conditions, if the weight loss continues may need to do spiral CT imaging to fully rule out the risk of malignancy.

## 2024-09-15 NOTE — Assessment & Plan Note (Signed)
 Chronic issue, B12 levels have been borderline despite use of oral B12 therapy.  Will check B12 and MMA today.  If these are still low, we will start IM B12 injections.

## 2024-09-15 NOTE — Progress Notes (Signed)
 New Patient Office Visit  Subjective    Patient ID: Cissy Galbreath, female    DOB: [birth year]  Age: 43 y.o. MRN: [id number]  CC:   Chief Complaint  Patient presents with   Establish Care    Hair falling out and exhausted   Stomach pain in 2021 pancreatic enzymes low 20lbs weight loss since February not trying     HPI  Discussed the use of AI scribe software for clinical note transcription with the patient, who gave verbal consent to proceed.  History of Present Illness Rosalie Buenaventura is a 43 year old female who presents with hair loss and unintentional weight loss.  She experiences chronic fatigue despite taking various vitamins, including iron and B vitamins, for several years. Her previous doctor advised increasing vitamin intake, but she continues to feel exhausted. She has been experiencing significant hair loss, first noticed by her hairdresser, and has unintentionally lost 20 pounds over the past six to seven months, which she was unaware of until her doctor mentioned it.  Her blood work over the years has shown fluctuating white blood cell counts and borderline low iron levels. Thyroid function tests revealed low normal levels. She takes iron supplements regularly, including iron sulfate in the morning and evening, and a less potent gummy iron supplement, yet her iron levels remain borderline low. She has not had an iron infusion before.  She has a history of stomach issues, including a period four years ago with persistent stomach upset. She was told by her gastroenterologist that her pancreatic enzyme levels were borderline low. She avoids rich foods as they upset her stomach and tries to eat a balanced diet with protein at every meal. She has not had any stomach surgeries but has had C-sections.  She experiences irregular bowel movements, typically having one per day unless her stomach is upset, which can wake her at night. She has been tested for celiac  disease, which was negative, and does not believe she has gluten issues. She has not had a colonoscopy or endoscopy, but a CT scan of her pancreas showed no structural issues.  She struggles with sleep, describing herself as a light sleeper with a busy mind that prevents her from falling asleep easily. She uses melatonin and valerian root to aid sleep, finding valerian root more effective. 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    Outpatient Encounter Medications as of 09/15/2024  Medication Sig   diazepam (VALIUM) 5 MG tablet Take 5 mg by mouth as needed.   tranexamic acid (LYSTEDA) 650 MG TABS tablet Take 1,300 mg by mouth 3 (three) times daily.   [DISCONTINUED] cetirizine (ZYRTEC) 10 MG tablet Take 1 tablet by mouth daily as needed. (Patient not taking: Reported on 09/15/2024)   [DISCONTINUED] diazepam (VALIUM) 2 MG tablet Take 2 mg by mouth 2 (two) times daily as needed.   [DISCONTINUED] escitalopram (LEXAPRO) 10 MG tablet Take 10 mg by mouth daily.   [DISCONTINUED] naproxen  (NAPROSYN ) 500 MG tablet Take 1 tablet (500 mg total) by mouth 2 (two) times daily.   [DISCONTINUED] Olopatadine HCl 0.2 % SOLN Place 1 drop into both eyes daily.   No facility-administered encounter medications on file as of 09/15/2024.    Past Medical History:  Diagnosis Date   Meniere disease     Surgical history of a C-section.  Family history positive for diabetes in her father, alcohol use disorder and multiple first-degree  family members      Objective    BP 112/77   Pulse 74   Ht 5' 4 (1.626 m)   Wt 170 lb 3.2 oz (77.2 kg)   BMI 29.21 kg/m   Physical Exam  Gen: Tired appearing woman, hirsutism noted modestly on her face and bilateral forearms Eyes: Normal Ears: Normal tympanic membranes bilaterally Neck: Normal thyroid, no nodules, no adenopathy Heart: Regular, no murmur Lungs: Unlabored, clear  throughout Abd: Soft, mild tenderness to palpation in the midepigastrium, no organomegaly, no striae Ext: Warm, no edema, normal joints Neuro: Alert, conversational, full strength upper and lower extremities, normal gait and balance Psych: Mildly depressed affect, normal speech, organized thoughts, pleasant to talk with      Assessment & Plan:    Problem List Items Addressed This Visit       High   Exocrine pancreatic insufficiency (Chronic)   Diagnosed around 2022 with stool testing.  She had a CT of the abdomen at that time that showed structurally normal pancreas.  Unclear etiology.  Differential would include autoimmune pancreatitis, infiltrative disease, or other rare congenital problems.  Symptom burden seems to be moderate, but the weight loss is progressing.  May need to do a trial of Creon in the future, especially if the diarrhea symptoms worsen.      Iron deficiency - Primary (Chronic)   Chronic iron deficiency in the setting of pancreatic insufficiency and somewhat heavy menstrual bleeding.  She has been using 3 tablets of ferrous sulfate on a daily basis for many years and based on the labs from August she is still iron deficient and symptomatic.  We talked about doing an iron infusion which she is agreeable.  I have ordered for 2 doses of Feraheme at the [facility] infusion center.  I recommended reducing the oral iron to just 1 tablet daily in order to improve absorption and reduce herceptin activity.      Relevant Orders   CBC with Differential/Platelet   IBC + Ferritin   POCT urine pregnancy (Completed)   Unintentional weight loss (Chronic)   20 pound weight loss history over the last 6 months unintentionally.  This is in the setting of multiple vitamin deficiencies including iron and B12 as well as history of pancreatic exocrine insufficiency of unclear etiology.  Weight loss certainly could be coming from a worsening of her GI absorption.  Celiac's disease  reportedly ruled out with serology testing.  Risk of an underlying malignancy seems low given her young age and reassuring exam.  The combination of weight loss, carpal tunnel, and pancreatic deficiency raises the risk of an infiltrative disorder like AL amyloidosis, hemochromatosis seems less likely given her comorbid iron deficiency.  Will start by checking labs today to rule out some of these conditions, if the weight loss continues may need to do spiral CT imaging to fully rule out the risk of malignancy.      Relevant Orders   CBC with Differential/Platelet   Comprehensive metabolic panel with GFR   Hemoglobin A1c   TSH   IFE, PE and FLC, Serum   T4, free     Medium    B12 deficiency (Chronic)   Chronic issue, B12 levels have been borderline despite use of oral B12 therapy.  Will check B12 and MMA today.  If these are still low, we will start IM B12 injections.      Relevant Orders   Vitamin B12   Methylmalonic acid(mma), rnd urine  Low   Hirsutism (Chronic)   Exam shows some symptoms of hyperandrogenism that are chronic.  No history of PCOS.  Her birth history has 1 miscarriage of twins, 2 other pregnancies.  Will check a 17-hydroxyprogesterone today to start evaluation for congenital adrenal hyperplasia.      Relevant Orders   17-Hydroxyprogesterone    Return in about 4 weeks (around 10/13/2024).   Cleatus Debby Specking, MD

## 2024-09-15 NOTE — Assessment & Plan Note (Signed)
 Diagnosed around 2022 with stool testing.  She had a CT of the abdomen at that time that showed structurally normal pancreas.  Unclear etiology.  Differential would include autoimmune pancreatitis, infiltrative disease, or other rare congenital problems.  Symptom burden seems to be moderate, but the weight loss is progressing.  May need to do a trial of Creon in the future, especially if the diarrhea symptoms worsen.

## 2024-09-15 NOTE — Patient Instructions (Signed)
  VISIT SUMMARY: During your visit, we discussed your ongoing issues with hair loss, unintentional weight loss, chronic fatigue, and sleep difficulties. We reviewed your history of iron deficiency, borderline vitamin B12 levels, and pancreatic insufficiency. We also talked about your sleep challenges and current treatments.  YOUR PLAN: -IRON DEFICIENCY WITH CHRONIC FATIGUE AND HAIR LOSS: Iron deficiency means your body does not have enough iron, which can cause fatigue and hair loss. Despite taking iron supplements, your iron levels remain low. We will arrange for an iron infusion to help increase your iron levels and see if this improves your symptoms. We will also check your blood work to monitor your iron status.  -BORDERLINE VITAMIN B12 DEFICIENCY: Vitamin B12 deficiency means your body does not have enough vitamin B12, which can contribute to fatigue. Your levels are borderline low despite taking supplements. We will check your blood work to see your current B12 status.  -BORDERLINE PANCREATIC INSUFFICIENCY: Pancreatic insufficiency means your pancreas is not producing enough enzymes to help digest food properly. Your test results show borderline levels, and we will review your previous medical records to understand more. Further testing may be needed based on what we find.  -CHRONIC INSOMNIA: Chronic insomnia means you have trouble falling or staying asleep. You are currently using Escitalopram to help with sleep. We will continue to monitor your sleep patterns and adjust treatment as needed.  INSTRUCTIONS: We will arrange for an iron infusion and check your updated blood work to monitor your iron and vitamin B12 levels. Please follow up with us  after these tests are completed.

## 2024-09-16 ENCOUNTER — Telehealth: Payer: Self-pay | Admitting: Pharmacy Technician

## 2024-09-16 ENCOUNTER — Ambulatory Visit: Payer: Self-pay | Admitting: Student in an Organized Health Care Education/Training Program

## 2024-09-16 NOTE — Telephone Encounter (Signed)
 Dr. Jerrell,  Allegra is non-preferred and will be denied if patient has not failed preferred medication. Preferred medication is Venofer. Would you like to use Venofer?  Auth Submission: NO AUTH NEEDED Site of care: Site of care: [facility] Payer: [employer] Medication & CPT/J Code(s) submitted: Venofer (Iron Sucrose) J1756 Diagnosis Code: D50.9 Route of submission (phone, fax, portal):  Phone # Fax # Auth type: Buy/Bill PB Units/visits requested: 200mg  x3 doses Reference number:  Approval from: 09/16/24 to 12/30/24

## 2024-09-17 ENCOUNTER — Ambulatory Visit (INDEPENDENT_AMBULATORY_CARE_PROVIDER_SITE_OTHER): Admitting: Student in an Organized Health Care Education/Training Program

## 2024-09-17 DIAGNOSIS — E538 Deficiency of other specified B group vitamins: Secondary | ICD-10-CM

## 2024-09-17 MED ORDER — CYANOCOBALAMIN 1000 MCG/ML IJ SOLN
1000.0000 ug | Freq: Once | INTRAMUSCULAR | Status: AC
  Administered 2024-09-24: 1000 ug via INTRAMUSCULAR

## 2024-09-17 NOTE — Progress Notes (Signed)
 Patient was scheduled for all weekly B12 injections. Need monthly B12 injections scheduled

## 2024-09-17 NOTE — Progress Notes (Signed)
 Diana Macdonald is a 43 y.o. female presents to the office today for B12 inkjestions per physician's orders. Injection was administered Intramuscular Right deltoid.   Patient's next injection due 1 week, appt made? yes  Bascom GORMAN Collet

## 2024-09-18 ENCOUNTER — Telehealth: Payer: Self-pay

## 2024-09-18 ENCOUNTER — Other Ambulatory Visit (HOSPITAL_COMMUNITY): Payer: Self-pay | Admitting: Student in an Organized Health Care Education/Training Program

## 2024-09-18 LAB — IFE, PE AND FLC, SERUM
Albumin SerPl Elph-Mcnc: 3.5 g/dL (ref 2.9–4.4)
Albumin/Glob SerPl: 1.3 (ref 0.7–1.7)
Alpha 1: 0.2 g/dL (ref 0.0–0.4)
Alpha2 Glob SerPl Elph-Mcnc: 0.7 g/dL (ref 0.4–1.0)
B-Globulin SerPl Elph-Mcnc: 1 g/dL (ref 0.7–1.3)
Gamma Glob SerPl Elph-Mcnc: 1.1 g/dL (ref 0.4–1.8)
Globulin, Total: 2.9 g/dL (ref 2.2–3.9)
Ig Kappa Free Light Chain: 17.4 mg/L (ref 3.3–19.4)
Ig Lambda Free Light Chain: 17.6 mg/L (ref 5.7–26.3)
IgA/Immunoglobulin A, Serum: 157 mg/dL (ref 87–352)
IgG (Immunoglobin G), Serum: 1118 mg/dL (ref 586–1602)
IgM (Immunoglobulin M), Srm: 72 mg/dL (ref 26–217)
KAPPA/LAMBDA RATIO: 0.99 (ref 0.26–1.65)
Total Protein: 6.4 g/dL (ref 6.0–8.5)

## 2024-09-18 NOTE — Telephone Encounter (Signed)
 Dr. Jerrell, patient will be scheduled as soon as possible.  Auth Submission: NO AUTH NEEDED Site of care: Site of care: [facility] Payer: [employer] commercial Medication & CPT/J Code(s) submitted: Venofer (Iron Sucrose) J1756 Diagnosis Code:  Route of submission (phone, fax, portal):  Phone # Fax # Auth type: Buy/Bill PB Units/visits requested: 200mg  x 3 doses Reference number:  Approval from: 09/18/24 to 12/30/24

## 2024-09-18 NOTE — Telephone Encounter (Signed)
 Ok. Thank you. Can you please update the order to Venofer 200mg  IV every week for 3 weeks?

## 2024-09-21 LAB — 17-HYDROXYPROGESTERONE: 17-OH-Progesterone, LC/MS/MS: 104 ng/dL

## 2024-09-22 ENCOUNTER — Ambulatory Visit (INDEPENDENT_AMBULATORY_CARE_PROVIDER_SITE_OTHER)

## 2024-09-22 VITALS — BP 106/72 | HR 73 | Temp 97.9°F | Resp 12 | Ht 64.0 in | Wt 169.0 lb

## 2024-09-22 DIAGNOSIS — E611 Iron deficiency: Secondary | ICD-10-CM

## 2024-09-22 MED ORDER — IRON SUCROSE 20 MG/ML IV SOLN
200.0000 mg | Freq: Once | INTRAVENOUS | Status: AC
  Administered 2024-09-22: 200 mg via INTRAVENOUS
  Filled 2024-09-22: qty 10

## 2024-09-22 NOTE — Patient Instructions (Signed)

## 2024-09-22 NOTE — Progress Notes (Signed)
 Diagnosis: Iron Deficiency Anemia  Provider:  Chilton Greathouse MD  Procedure: IV Push  IV Type: Peripheral, IV Location: L Antecubital  Venofer (Iron Sucrose), Dose: 200 mg  Post Infusion IV Care: Observation period completed and Peripheral IV Discontinued  Discharge: Condition: Stable, Destination: Home . AVS Provided  Performed by:  Wyvonne Lenz, RN

## 2024-09-24 ENCOUNTER — Ambulatory Visit (INDEPENDENT_AMBULATORY_CARE_PROVIDER_SITE_OTHER)

## 2024-09-24 DIAGNOSIS — E538 Deficiency of other specified B group vitamins: Secondary | ICD-10-CM | POA: Diagnosis not present

## 2024-09-24 NOTE — Progress Notes (Signed)
 Diana Macdonald is a 43 y.o. female presents to the office today for 2nd B12 Injection  per physician's orders. Injection was administered Intramuscular Left deltoid.   Patient's next injection due 10/01/2024, appt made? yes  [facility] R Barnell Shieh

## 2024-09-29 ENCOUNTER — Ambulatory Visit (INDEPENDENT_AMBULATORY_CARE_PROVIDER_SITE_OTHER)

## 2024-09-29 VITALS — BP 112/78 | HR 62 | Temp 97.9°F | Resp 16 | Ht 64.0 in | Wt 169.2 lb

## 2024-09-29 DIAGNOSIS — E611 Iron deficiency: Secondary | ICD-10-CM | POA: Diagnosis not present

## 2024-09-29 MED ORDER — IRON SUCROSE 20 MG/ML IV SOLN
200.0000 mg | Freq: Once | INTRAVENOUS | Status: AC
  Administered 2024-09-29: 200 mg via INTRAVENOUS
  Filled 2024-09-29: qty 10

## 2024-09-29 NOTE — Progress Notes (Signed)
 Diagnosis: Iron Deficiency Anemia  Provider:  Chilton Greathouse MD  Procedure: IV Push  IV Type: Peripheral, IV Location: R Antecubital  Venofer (Iron Sucrose), Dose: 200 mg  Post Infusion IV Care: Observation period completed and Peripheral IV Discontinued  Discharge: Condition: Good, Destination: Home . AVS Declined  Performed by:  Adriana Mccallum, RN

## 2024-10-01 ENCOUNTER — Ambulatory Visit (INDEPENDENT_AMBULATORY_CARE_PROVIDER_SITE_OTHER)

## 2024-10-01 DIAGNOSIS — E538 Deficiency of other specified B group vitamins: Secondary | ICD-10-CM

## 2024-10-01 MED ORDER — CYANOCOBALAMIN 1000 MCG/ML IJ SOLN
1000.0000 ug | Freq: Once | INTRAMUSCULAR | Status: AC
  Administered 2024-10-01: 1000 ug via INTRAMUSCULAR

## 2024-10-01 NOTE — Progress Notes (Signed)
 Diana Macdonald is a 43 y.o. female presents to the office today for 3rd B12 injection per physician's orders. Injection was administered Intramuscular Right deltoid.   Patient's next injection due 10/08/24, appt made? yes  Alfredo DELENA Shope

## 2024-10-06 ENCOUNTER — Ambulatory Visit (INDEPENDENT_AMBULATORY_CARE_PROVIDER_SITE_OTHER)

## 2024-10-06 VITALS — BP 102/70 | HR 67 | Temp 98.1°F | Resp 16 | Ht 64.0 in | Wt 166.6 lb

## 2024-10-06 DIAGNOSIS — E611 Iron deficiency: Secondary | ICD-10-CM

## 2024-10-06 MED ORDER — SODIUM CHLORIDE 0.9 % IV BOLUS
250.0000 mL | Freq: Once | INTRAVENOUS | Status: AC
  Administered 2024-10-06: 250 mL via INTRAVENOUS
  Filled 2024-10-06: qty 250

## 2024-10-06 MED ORDER — IRON SUCROSE 20 MG/ML IV SOLN
200.0000 mg | Freq: Once | INTRAVENOUS | Status: AC
  Administered 2024-10-06: 200 mg via INTRAVENOUS
  Filled 2024-10-06: qty 10

## 2024-10-06 NOTE — Progress Notes (Signed)
 Diagnosis: Iron Deficiency Anemia  Provider:  Chilton Greathouse MD  Procedure: IV Push  IV Type: Peripheral, IV Location: L Hand  Venofer (Iron Sucrose), Dose: 200 mg  Post Infusion IV Care: Observation period completed and Peripheral IV Discontinued  Discharge: Condition: Good, Destination: Home . AVS Declined  Performed by:  Loney Hering, LPN

## 2024-10-08 ENCOUNTER — Ambulatory Visit (INDEPENDENT_AMBULATORY_CARE_PROVIDER_SITE_OTHER)

## 2024-10-08 DIAGNOSIS — E538 Deficiency of other specified B group vitamins: Secondary | ICD-10-CM | POA: Diagnosis not present

## 2024-10-08 MED ORDER — CYANOCOBALAMIN 1000 MCG/ML IJ SOLN
1000.0000 ug | Freq: Once | INTRAMUSCULAR | Status: AC
  Administered 2024-10-08: 1000 ug via INTRAMUSCULAR

## 2024-10-08 NOTE — Progress Notes (Signed)
 Diana Macdonald is a 43 y.o. female presents in office today for a nurse visit for 4th B12 Injection.   Patient Injection was given in the  Right deltoid. Patient tolerated injection well.   Patient's next injection due 1 month 11/08/2024, appt made? no  [employer]

## 2024-10-12 ENCOUNTER — Inpatient Hospital Stay: Attending: Oncology | Admitting: Oncology

## 2024-10-12 ENCOUNTER — Inpatient Hospital Stay

## 2024-10-12 ENCOUNTER — Inpatient Hospital Stay: Admitting: Oncology

## 2024-10-12 ENCOUNTER — Encounter: Payer: Self-pay | Admitting: Oncology

## 2024-10-12 VITALS — BP 109/75 | HR 77 | Temp 97.7°F | Resp 15 | Ht 64.0 in | Wt 168.7 lb

## 2024-10-12 DIAGNOSIS — D509 Iron deficiency anemia, unspecified: Secondary | ICD-10-CM | POA: Diagnosis not present

## 2024-10-12 DIAGNOSIS — N920 Excessive and frequent menstruation with regular cycle: Secondary | ICD-10-CM | POA: Insufficient documentation

## 2024-10-12 DIAGNOSIS — E538 Deficiency of other specified B group vitamins: Secondary | ICD-10-CM

## 2024-10-12 DIAGNOSIS — E611 Iron deficiency: Secondary | ICD-10-CM

## 2024-10-12 LAB — CBC WITH DIFFERENTIAL (CANCER CENTER ONLY)
Abs Immature Granulocytes: 0.01 K/uL (ref 0.00–0.07)
Basophils Absolute: 0 K/uL (ref 0.0–0.1)
Basophils Relative: 1 %
Eosinophils Absolute: 0.1 K/uL (ref 0.0–0.5)
Eosinophils Relative: 3 %
HCT: 41.8 % (ref 36.0–46.0)
Hemoglobin: 13.7 g/dL (ref 12.0–15.0)
Immature Granulocytes: 0 %
Lymphocytes Relative: 25 %
Lymphs Abs: 1.4 K/uL (ref 0.7–4.0)
MCH: 28.4 pg (ref 26.0–34.0)
MCHC: 32.8 g/dL (ref 30.0–36.0)
MCV: 86.7 fL (ref 80.0–100.0)
Monocytes Absolute: 0.3 K/uL (ref 0.1–1.0)
Monocytes Relative: 5 %
Neutro Abs: 3.8 K/uL (ref 1.7–7.7)
Neutrophils Relative %: 66 %
Platelet Count: 192 K/uL (ref 150–400)
RBC: 4.82 MIL/uL (ref 3.87–5.11)
RDW: 13 % (ref 11.5–15.5)
WBC Count: 5.7 K/uL (ref 4.0–10.5)
nRBC: 0 % (ref 0.0–0.2)

## 2024-10-12 LAB — FERRITIN: Ferritin: 265 ng/mL (ref 11–307)

## 2024-10-12 LAB — FOLATE: Folate: 6.4 ng/mL (ref >5.9)

## 2024-10-12 LAB — IRON AND TIBC
Iron: 56 ug/dL (ref 28–170)
Saturation Ratios: 18 % (ref 10.4–31.8)
TIBC: 315 ug/dL (ref 250–450)
UIBC: 259 ug/dL

## 2024-10-12 NOTE — Progress Notes (Signed)
 [facility]  HEMATOLOGY CLINIC CONSULTATION NOTE   PATIENT NAME: Diana Macdonald   MR#: [id number] DOB: 1981-02-09  DATE OF SERVICE: 10/12/2024  Patient Care Team: Jerrell Cleatus Ned, MD as PCP - General (Internal Medicine)  REASON FOR CONSULTATION/ CHIEF COMPLAINT:  Evaluation of iron  deficiency and B12 deficiency  ASSESSMENT & PLAN:   Diana Macdonald is a 43 y.o. lady with a past medical history of Mnire's disease, vitamin B12 deficiency and iron  deficiency was referred to our service for evaluation of iron  deficiency and B12 deficiency.  B12 deficiency Chronic iron  deficiency anemia likely due to menstrual blood loss, with low-normal ferritin and slightly low iron  saturation.   Vitamin B12 deficiency improved with injections, alleviating symptoms such as appetite loss, fatigue, paresthesia, and dizziness. No gastrointestinal blood loss or family history of colon cancer. Differential diagnosis includes pernicious anemia.  Labs today showed stable hemoglobin of 13.7, hematocrit 41.8, MCV normal at 86.7.  White count platelet count are within normal limits.  Iron  studies show no evidence of iron  deficiency.  - Order methylmalonic acid level - Order intrinsic factor antibody test - Order antiparietal cell antibody test - Continue B12 injections monthly if pernicious anemia is confirmed - Check iron  levels today  I will call her with results once available.  RTC in 4 months for follow-up with repeat labs.  Iron  deficiency Currently on oral iron  supplement once daily and tolerating it well.  Iron  studies reveal normal iron  parameters.  No indication for IV iron  at this time.   I reviewed lab results and outside records for this visit and discussed relevant results with the patient. Diagnosis, plan of care and treatment options were also discussed in detail with the patient. Opportunity provided to ask questions and answers provided to her  apparent satisfaction. Provided instructions to call our clinic with any problems, questions or concerns prior to return visit. I recommended to continue follow-up with PCP and sub-specialists. She verbalized understanding and agreed with the plan. No barriers to learning was detected.  Ygnacio Fecteau, MD [facility] - A DEPT OF [facility] [id number] [place] [address] Dept: [phone] Dept Fax: [phone]  10/12/2024 10:28 AM  HISTORY OF PRESENT ILLNESS:  Discussed the use of AI scribe software for clinical note transcription with the patient, who gave verbal consent to proceed.  History of Present Illness Diana Macdonald is a 43 year old female with anemia who presents for evaluation of her condition. She was referred by Dr. Jerrell Ned for evaluation of iron  deficiency and B12 deficiency.  Labs at her PCPs office on 09/15/2024 showed hemoglobin of 13.4, hematocrit 40.3, MCV 84.9.  White count and platelet count were within normal limits.  Vitamin B12 was decreased to 893 pg/mL.  Iron  saturation was decreased at 12%, ferritin normal at 19, although low.  She has a history of low hemoglobin levels, with recent lab results indicating low iron  and B12 levels. She has been receiving B12 injections for four weeks and iron  supplementation for three weeks, noting improvement in symptoms such as increased energy, better appetite, reduced tingling in her hands and feet, and decreased dizziness upon standing.  She has been taking over-the-counter iron  supplements (65 mg ferrous sulfate, 325 mg) and B vitamins for approximately three years. She also takes magnesium occasionally to aid sleep and a multivitamin. No significant blood loss is reported, though she occasionally notices a small amount  of blood on toilet paper. She has not undergone a colonoscopy and has no family history of colon cancer.  She  experiences fatigue and has been prescribed tranexamic acid by her gynecologist to manage potentially heavy menstrual cycles, which she has been taking for about a year. She reports feeling winded easily, though this has improved over the past month. No chest pain, regular headaches, or vision problems.  She has a history of Meniere's disease, with symptoms including vertigo and hearing loss in her left ear, but no significant tinnitus. She experiences a 'whooshing' sound in her ears, akin to her heartbeat. She has undergone surgeries including C-sections, carpal tunnel release on the right side, ankle ligament repair, and rotator cuff surgery.  She denies smoking and reports infrequent alcohol consumption. She has no family history of Meniere's disease.    MEDICAL HISTORY:  Past Medical History:  Diagnosis Date   Meniere disease     SURGICAL HISTORY: Past Surgical History:  Procedure Laterality Date   ANKLE SURGERY     CARPAL TUNNEL RELEASE Right    CESAREAN SECTION     ROTATOR CUFF REPAIR      SOCIAL HISTORY: She reports that she has never smoked. She has never used smokeless tobacco. She reports current alcohol use. She reports that she does not use drugs. Social History   Socioeconomic History   Marital status: Married    Spouse name: Not on file   Number of children: Not on file   Years of education: Not on file   Highest education level: Master's degree (e.g., MA, MS, MEng, MEd, MSW, MBA)  Occupational History   Not on file  Tobacco Use   Smoking status: Never   Smokeless tobacco: Never  Substance and Sexual Activity   Alcohol use: Yes    Comment: Occasional, social   Drug use: Never   Sexual activity: Not on file  Other Topics Concern   Not on file  Social History Narrative   Not on file   Social Drivers of Health   Financial Resource Strain: Low Risk  (09/15/2024)   Overall Financial Resource Strain (CARDIA)    Difficulty of Paying Living Expenses: Not hard  at all  Food Insecurity: No Food Insecurity (10/12/2024)   Hunger Vital Sign    Worried About Running Out of Food in the Last Year: Never true    Ran Out of Food in the Last Year: Never true  Transportation Needs: No Transportation Needs (10/12/2024)   PRAPARE - Administrator, Civil Service (Medical): No    Lack of Transportation (Non-Medical): No  Physical Activity: Insufficiently Active (09/15/2024)   Exercise Vital Sign    Days of Exercise per Week: 5 days    Minutes of Exercise per Session: 20 min  Stress: Stress Concern Present (09/15/2024)   [employer] of Occupational Health - Occupational Stress Questionnaire    Feeling of Stress: To some extent  Social Connections: Moderately Integrated (09/15/2024)   Social Connection and Isolation Panel    Frequency of Communication with Friends and Family: More than three times a week    Frequency of Social Gatherings with Friends and Family: Twice a week    Attends Religious Services: 1 to 4 times per year    Active Member of [employer] or Organizations: No    Attends Banker Meetings: Not on file    Marital Status: Married  Intimate Partner Violence: Not At Risk (10/12/2024)   Humiliation, Afraid, Rape, and Kick  questionnaire    Fear of Current or Ex-Partner: No    Emotionally Abused: No    Physically Abused: No    Sexually Abused: No    FAMILY HISTORY: No family history on file.  ALLERGIES:  She is allergic to codeine and gramineae pollens.  MEDICATIONS:  Current Outpatient Medications  Medication Sig Dispense Refill   b complex vitamins capsule Take 1 capsule by mouth daily.     diazepam (VALIUM) 5 MG tablet Take 5 mg by mouth as needed.     ferrous sulfate 324 MG TBEC Take 324 mg by mouth.     Multiple Vitamin (MULTIVITAMIN WITH MINERALS) TABS tablet Take 1 tablet by mouth daily.     tranexamic acid (LYSTEDA) 650 MG TABS tablet Take 1,300 mg by mouth 3 (three) times daily.     traZODone (DESYREL) 50  MG tablet Take 50 mg by mouth at bedtime as needed for sleep.     No current facility-administered medications for this visit.    REVIEW OF SYSTEMS:    Review of Systems - Oncology  All other pertinent systems were reviewed and were negative except as mentioned above.  PHYSICAL EXAMINATION:   Onc Performance Status - 10/12/24 0959       KPS SCALE   KPS % SCORE Cares for self, unable to carry on normal activity or to do active work          Vitals:   10/12/24 0952  BP: 109/75  Pulse: 77  Resp: 15  Temp: 97.7 F (36.5 C)  SpO2: 100%   Filed Weights   10/12/24 0952  Weight: 168 lb 11.2 oz (76.5 kg)    Physical Exam Constitutional:      General: She is not in acute distress.    Appearance: Normal appearance.  HENT:     Head: Normocephalic and atraumatic.  Cardiovascular:     Rate and Rhythm: Normal rate.  Pulmonary:     Effort: Pulmonary effort is normal. No respiratory distress.  Abdominal:     General: There is no distension.  Neurological:     General: No focal deficit present.     Mental Status: She is alert and oriented to person, place, and time.  Psychiatric:        Mood and Affect: Mood normal.        Behavior: Behavior normal.     LABORATORY DATA:   I have reviewed the data as listed.  Results for orders placed or performed in visit on 10/12/24  Anti-parietal antibody  Result Value Ref Range   Parietal Cell Antibody-IgG 0.8 0.0 - 20.0 Units  Intrinsic Factor Antibodies  Result Value Ref Range   Intrinsic Factor 1.1 0.0 - 1.1 AU/mL  Methylmalonic acid, serum  Result Value Ref Range   Methylmalonic Acid, Quantitative 124 0 - 378 nmol/L  Ferritin  Result Value Ref Range   Ferritin 265 11 - 307 ng/mL  Iron  and TIBC  Result Value Ref Range   Iron  56 28 - 170 ug/dL   TIBC 684 749 - 549 ug/dL   Saturation Ratios 18 10.4 - 31.8 %   UIBC 259 ug/dL  CBC with Differential (Cancer Center Only)  Result Value Ref Range   WBC Count 5.7 4.0 -  10.5 K/uL   RBC 4.82 3.87 - 5.11 MIL/uL   Hemoglobin 13.7 12.0 - 15.0 g/dL   HCT 58.1 63.9 - 53.9 %   MCV 86.7 80.0 - 100.0 fL   MCH 28.4 26.0 -  34.0 pg   MCHC 32.8 30.0 - 36.0 g/dL   RDW 86.9 88.4 - 84.4 %   Platelet Count 192 150 - 400 K/uL   nRBC 0.0 0.0 - 0.2 %   Neutrophils Relative % 66 %   Neutro Abs 3.8 1.7 - 7.7 K/uL   Lymphocytes Relative 25 %   Lymphs Abs 1.4 0.7 - 4.0 K/uL   Monocytes Relative 5 %   Monocytes Absolute 0.3 0.1 - 1.0 K/uL   Eosinophils Relative 3 %   Eosinophils Absolute 0.1 0.0 - 0.5 K/uL   Basophils Relative 1 %   Basophils Absolute 0.0 0.0 - 0.1 K/uL   Immature Granulocytes 0 %   Abs Immature Granulocytes 0.01 0.00 - 0.07 K/uL  Folate  Result Value Ref Range   Folate 6.4 >5.9 ng/mL     RADIOGRAPHIC STUDIES:  No recent pertinent imaging studies available to review.  Orders Placed This Encounter  Procedures   CBC with Differential (Cancer Center Only)    Standing Status:   Future    Number of Occurrences:   1    Expiration Date:   10/12/2025   Iron  and TIBC    Standing Status:   Future    Number of Occurrences:   1    Expiration Date:   10/12/2025   Ferritin    Standing Status:   Future    Number of Occurrences:   1    Expiration Date:   10/12/2025   Folate    Standing Status:   Future    Number of Occurrences:   1    Expiration Date:   10/12/2025   Methylmalonic acid, serum    Standing Status:   Future    Number of Occurrences:   1    Expiration Date:   10/12/2025   Intrinsic Factor Antibodies    Standing Status:   Future    Number of Occurrences:   1    Expiration Date:   10/12/2025   Anti-parietal antibody    Standing Status:   Future    Number of Occurrences:   1    Expiration Date:   10/12/2025    Future Appointments  Date Time Provider Department Center  12/04/2024  9:00 AM [facility] CLINICAL SUPPORT [facility] [place]  01/04/2025  8:30 AM [facility] CLINICAL [facility]  01/13/2025  9:40 AM Jerrell Cleatus Ned, MD [facility] [place]  02/04/2025  8:30 AM [facility] [place]  02/15/2025 11:15 AM [facility] PHLEBOTOMIST [facility]  02/15/2025 11:45 AM Nelwyn Hebdon, Chinita, MD [facility] None  03/04/2025  8:30 AM [facility] [place]     I spent a total of 42 minutes during this encounter with the patient including review of chart and various tests results, discussions about plan of care and coordination of care plan.  This document was completed utilizing speech recognition software. Grammatical errors, random word insertions, pronoun errors, and incomplete sentences are an occasional consequence of this system due to software limitations, ambient noise, and hardware issues. Any formal questions or concerns about the content, text or information contained within the body of this dictation should be directly addressed to the provider for clarification.

## 2024-10-13 ENCOUNTER — Ambulatory Visit: Admitting: Student in an Organized Health Care Education/Training Program

## 2024-10-13 VITALS — BP 107/69 | HR 79 | Wt 167.0 lb

## 2024-10-13 DIAGNOSIS — E538 Deficiency of other specified B group vitamins: Secondary | ICD-10-CM | POA: Diagnosis not present

## 2024-10-13 DIAGNOSIS — K8681 Exocrine pancreatic insufficiency: Secondary | ICD-10-CM | POA: Diagnosis not present

## 2024-10-13 DIAGNOSIS — Z23 Encounter for immunization: Secondary | ICD-10-CM

## 2024-10-13 DIAGNOSIS — E611 Iron deficiency: Secondary | ICD-10-CM

## 2024-10-13 DIAGNOSIS — R634 Abnormal weight loss: Secondary | ICD-10-CM | POA: Diagnosis not present

## 2024-10-13 DIAGNOSIS — G5603 Carpal tunnel syndrome, bilateral upper limbs: Secondary | ICD-10-CM

## 2024-10-13 LAB — INTRINSIC FACTOR ANTIBODIES: Intrinsic Factor: 1.1 [AU]/ml (ref 0.0–1.1)

## 2024-10-13 LAB — ANTI-PARIETAL ANTIBODY: Parietal Cell Antibody-IgG: 0.8 U (ref 0.0–20.0)

## 2024-10-13 NOTE — Assessment & Plan Note (Signed)
 Managed with B12 injections, leading to symptom improvement post-loading phase, though not fully resolved. Schedule monthly B12 injections starting in November and monitor for symptoms of B12 deficiency such as neuropathies or balance issues.

## 2024-10-13 NOTE — Assessment & Plan Note (Signed)
 Weight has stabilized with adequate eating and improved appetite post-B12 injections, though a slight weight decrease is noted. Monitor weight and dietary intake, and encourage maintaining a balanced diet with adequate caloric intake.

## 2024-10-13 NOTE — Patient Instructions (Signed)
  VISIT SUMMARY: Today, we reviewed your progress with vitamin B12 and iron  deficiency treatments. You reported feeling better overall, with reduced fatigue and improved symptoms since starting B12 injections. Your iron  levels have improved with infusions, and you are experiencing fewer symptoms. We discussed your current diet, weight changes, and ongoing treatments.  YOUR PLAN: -IRON  DEFICIENCY: Iron  deficiency means your body does not have enough iron , which is essential for making red blood cells. Your iron  levels have improved with infusions, but symptoms remain unchanged. Continue taking oral iron  supplements and we will recheck your iron  levels in three months.  -VITAMIN B12 DEFICIENCY: Vitamin B12 deficiency means your body lacks enough B12, which is important for nerve function and making red blood cells. You have shown improvement with B12 injections. We will start monthly B12 injections in November and monitor for any symptoms like tingling or balance issues.  -EXOCRINE PANCREATIC INSUFFICIENCY: Exocrine pancreatic insufficiency means your pancreas does not produce enough enzymes to properly digest food, which can affect nutrient absorption. Your appetite and gastrointestinal symptoms have improved with B12 therapy. We will continue to monitor your nutritional status and follow up with hematology for further testing.  -UNINTENTIONAL WEIGHT LOSS: Unintentional weight loss means losing weight without trying, which can be a sign of an underlying issue. Your weight has stabilized with improved appetite and eating habits. We will monitor your weight and dietary intake, and encourage you to maintain a balanced diet with enough calories.  INSTRUCTIONS: Please continue taking your oral iron  supplements and schedule your monthly B12 injections starting in November. We will recheck your iron  levels in three months. Follow up with hematology for the results of your intrinsic factor testing. Maintain a  balanced diet and monitor your weight. If you notice any new symptoms or have concerns, please contact our office.

## 2024-10-13 NOTE — Progress Notes (Signed)
 Established Patient Office Visit  Subjective   Patient ID: Diana Macdonald, female    DOB: Oct 30, 1981  Age: 43 y.o. MRN: [id number]  Chief Complaint  Patient presents with   Medical Management of Chronic Issues    4 week follow up     HPI  Discussed the use of AI scribe software for clinical note transcription with the patient, who gave verbal consent to proceed.  History of Present Illness Diana Macdonald is a 43 year old female with vitamin B12 and iron  deficiency who presents for follow-up of her treatment response.  She feels better overall with reduced fatigue, although some tiredness persists. She engages in more activities than before. Significant improvement in symptoms has been noted since starting B12 injections, including resolution of tingling in her hands and feet, improved appetite, and reduced brain fog. She experiences less dizziness upon standing, which she had not realized was an issue before.  She has received four weekly B12 injections, with notable symptom improvement after each injection. She completed three iron  infusions over three weeks but did not notice a significant difference, possibly due to concurrent B12 treatment.  Hematology conducted additional tests, including an MMA test. She is awaiting further results from hematology.  Her diet includes three meals a day with protein at each meal, and she has not been intentionally trying to lose weight. Despite this, she has lost three pounds over the past four weeks. She reports normal bowel movements and no upset stomach since starting B12 injections.  She continues to take tranexamic acid during her periods to manage heavy bleeding and uses iron  tablets without any stomach upset. Her iron  levels have improved significantly, with ferritin and saturation levels increasing to normal ranges.  She reports normal and regular menstrual periods. No current stomach upset and improved energy levels with  less windedness during activities.      Objective:     BP 107/69   Pulse 79   Wt 167 lb (75.8 kg)   BMI 28.67 kg/m   Physical Exam  Gen: Well-appearing woman Neck: Normal thyroid, no nodules or adenopathy Heart: Regular, no murmur Lungs: Unlabored, clear throughout Abd: Soft, nontender, no organomegaly Ext: Warm, no edema, no rash    Assessment & Plan:     Problem List Items Addressed This Visit       High   Exocrine pancreatic insufficiency (Chronic)   This condition may contribute to B12 and iron  malabsorption, with intrinsic factor deficiency under investigation. Appetite and gastrointestinal symptoms have improved with B12 therapy. Continue monitoring nutritional status and absorption issues.  Only having 1 bowel movement per day and no GI symptoms to suggest the need for pancreatic enzyme replacement.  Will watch her weight carefully.      Iron  deficiency (Chronic)   Iron  levels have improved with infusions, as evidenced by increased ferritin and iron  saturation, though symptoms remain unchanged. Continue oral iron  supplementation and recheck iron  levels in three months.      Unintentional weight loss - Primary (Chronic)   Weight has stabilized with adequate eating and improved appetite post-B12 injections, though a slight weight decrease is noted. Monitor weight and dietary intake, and encourage maintaining a balanced diet with adequate caloric intake.        Medium    B12 deficiency (Chronic)   Managed with B12 injections, leading to symptom improvement post-loading phase, though not fully resolved. Schedule monthly B12 injections starting in November and monitor for symptoms of B12 deficiency such  as neuropathies or balance issues.        Low   Bilateral carpal tunnel syndrome (Chronic)   Much improved after starting B12 replacement therapy.      Other Visit Diagnoses       Needs flu shot       Relevant Orders   Flu vaccine trivalent PF, 6mos and  older(Flulaval,Afluria,Fluarix,Fluzone) (Completed)       Return in about 3 months (around 01/13/2025).    Cleatus Debby Specking, MD

## 2024-10-13 NOTE — Assessment & Plan Note (Signed)
 This condition may contribute to B12 and iron  malabsorption, with intrinsic factor deficiency under investigation. Appetite and gastrointestinal symptoms have improved with B12 therapy. Continue monitoring nutritional status and absorption issues.  Only having 1 bowel movement per day and no GI symptoms to suggest the need for pancreatic enzyme replacement.  Will watch her weight carefully.

## 2024-10-13 NOTE — Assessment & Plan Note (Signed)
 Iron  levels have improved with infusions, as evidenced by increased ferritin and iron  saturation, though symptoms remain unchanged. Continue oral iron  supplementation and recheck iron  levels in three months.

## 2024-10-13 NOTE — Assessment & Plan Note (Signed)
 Much improved after starting B12 replacement therapy.

## 2024-10-16 ENCOUNTER — Encounter: Payer: Self-pay | Admitting: Student in an Organized Health Care Education/Training Program

## 2024-10-18 LAB — METHYLMALONIC ACID, SERUM: Methylmalonic Acid, Quantitative: 124 nmol/L (ref 0–378)

## 2024-11-06 ENCOUNTER — Ambulatory Visit (INDEPENDENT_AMBULATORY_CARE_PROVIDER_SITE_OTHER)

## 2024-11-06 DIAGNOSIS — E538 Deficiency of other specified B group vitamins: Secondary | ICD-10-CM

## 2024-11-06 MED ORDER — CYANOCOBALAMIN 1000 MCG/ML IJ SOLN
1000.0000 ug | Freq: Once | INTRAMUSCULAR | Status: AC
  Administered 2024-11-06: 1000 ug via INTRAMUSCULAR

## 2024-11-06 NOTE — Progress Notes (Signed)
 Diana Macdonald is a 43 y.o. female presents in office today for a nurse visit for B12 Injection.   Patient Injection was given in the  Right deltoid. Patient tolerated injection well.   Patient's next injection due on one month, appt made? yes  Burnard Gaskins

## 2024-11-10 ENCOUNTER — Telehealth: Payer: Self-pay | Admitting: Oncology

## 2024-11-10 ENCOUNTER — Encounter: Payer: Self-pay | Admitting: Oncology

## 2024-11-10 ENCOUNTER — Ambulatory Visit

## 2024-11-10 NOTE — Assessment & Plan Note (Signed)
 Chronic iron  deficiency anemia likely due to menstrual blood loss, with low-normal ferritin and slightly low iron  saturation.   Vitamin B12 deficiency improved with injections, alleviating symptoms such as appetite loss, fatigue, paresthesia, and dizziness. No gastrointestinal blood loss or family history of colon cancer. Differential diagnosis includes pernicious anemia.  Labs today showed stable hemoglobin of 13.7, hematocrit 41.8, MCV normal at 86.7.  White count platelet count are within normal limits.  Iron  studies show no evidence of iron  deficiency.  - Order methylmalonic acid level - Order intrinsic factor antibody test - Order antiparietal cell antibody test - Continue B12 injections monthly if pernicious anemia is confirmed - Check iron  levels today  I will call her with results once available.  RTC in 4 months for follow-up with repeat labs.

## 2024-11-10 NOTE — Assessment & Plan Note (Signed)
 Currently on oral iron  supplement once daily and tolerating it well.  Iron  studies reveal normal iron  parameters.  No indication for IV iron  at this time.

## 2024-11-10 NOTE — Telephone Encounter (Signed)
 Called PT to schedule telephone appt. Day and time confirmed.

## 2024-11-16 ENCOUNTER — Encounter: Payer: Self-pay | Admitting: Oncology

## 2024-11-16 ENCOUNTER — Inpatient Hospital Stay: Attending: Oncology | Admitting: Oncology

## 2024-11-16 DIAGNOSIS — E538 Deficiency of other specified B group vitamins: Secondary | ICD-10-CM

## 2024-11-16 DIAGNOSIS — E611 Iron deficiency: Secondary | ICD-10-CM | POA: Diagnosis not present

## 2024-11-16 NOTE — Progress Notes (Signed)
 [facility]  HEMATOLOGY-ONCOLOGY ELECTRONIC VISIT PROGRESS NOTE  PATIENT NAME: Diana Macdonald   MR#: [id number] DOB: 1981-10-19  DATE OF SERVICE: 11/16/2024  Patient Care Team: Jerrell Cleatus Ned, MD as PCP - General (Internal Medicine)  I connected with the patient via telephone conference and verified that I am speaking with the correct person using two identifiers. The patient's location is at home and I am providing care from the [facility].  I discussed the limitations, risks, security and privacy concerns of performing an evaluation and management service by e-visits and the availability of in person appointments. I also discussed with the patient that there may be a patient responsible charge related to this service. The patient expressed understanding and agreed to proceed.   ASSESSMENT & PLAN:   Diana Macdonald is a 43 y.o.  lady with a past medical history of Mnire's disease, vitamin B12 deficiency and iron  deficiency was referred to our service in October 2025 for evaluation of iron  deficiency and B12 deficiency.    Vitamin B12 deficiency, currently treated with injections Vitamin B12 deficiency previously treated with weekly injections, now reduced to monthly. B12 levels were slightly below normal in September but have improved with treatment. Methylmalonic acid levels are normal, indicating adequate B12 utilization. No evidence of B12 absorption issues as anti-intrinsic factor and anti-parietal cell antibodies are negative. Symptoms of paresthesia and fatigue improve with B12 injections, suggesting a benefit from continued treatment. - Continue B12 injections every four weeks. - Will consider increasing B12 injections to every two weeks if symptoms persist.  Iron  deficiency, currently treated with supplementation Iron  deficiency previously treated with iron  supplements and infusions. Iron  saturation has improved from 12% to 18%. No  current evidence of anemia. Symptoms of hair loss and fatigue persist, but iron  levels are now normal. - Continue current iron  supplementation.  Fatigue, under evaluation Persistent fatigue despite normal blood counts and iron  levels. Symptoms improve with B12 injections, suggesting a possible link to B12 deficiency. No clear hematological cause identified.  Paresthesia, under evaluation Paresthesia in hands improves with B12 injections, indicating a possible link to B12 deficiency. No evidence of B12 absorption issues. - Continue B12 injections as per current plan.  I discussed the assessment and treatment plan with the patient. The patient was provided an opportunity to ask questions and all were answered. The patient agreed with the plan and demonstrated an understanding of the instructions. The patient was advised to call back or seek an in-person evaluation if the symptoms worsen or if the condition fails to improve as anticipated.    I spent 12 minutes over the phone with the patient reviewing test results, discuss management and coordination/planning of care.  Chinita Patten, MD 11/16/2024 1:49 PM  [facility] - A DEPT OF [facility] [address] [place] [address] Dept: [phone] Dept Fax: [phone]   INTERVAL HISTORY:  Please see above for problem oriented charting.  The purpose of today's discussion is to explain recent lab results and to formulate plan of care.  Discussed the use of AI scribe software for clinical note transcription with the patient, who gave verbal consent to proceed.  History of Present Illness Diana Macdonald is a 43 year old female who presents with low B12 levels and associated symptoms despite supplementation.  She has been experiencing low B12 levels despite taking B12 supplements twice daily for several years. Her B12 level was 193, slightly  below normal, in  September, although it was normal in August and in 2020. She has been receiving B12 injections, initially weekly and now monthly, which have improved her symptoms temporarily. She experiences tingling in her hands, dizziness upon standing, trouble sleeping, and pins and needles, which improve significantly for about a week after receiving the B12 shot.  She has been taking iron  supplements for two and a half years. Initially, her iron  saturation was low at 12%, but it improved to 18% after iron  infusions. Despite this, she reports symptoms such as hair loss.  She reports persistent low energy levels, which have improved but are still present. She also mentions having a very sensitive stomach and feeling very tired for a very long time. Her thyroid function tests, including T4, were normal.   SUMMARY OF HEMATOLOGY HISTORY:  She was referred by Dr. Jerrell Ned for evaluation of iron  deficiency and B12 deficiency.   Labs at her PCPs office on 09/15/2024 showed hemoglobin of 13.4, hematocrit 40.3, MCV 84.9.  White count and platelet count were within normal limits.  Vitamin B12 was decreased to 193 pg/mL.  Iron  saturation was decreased at 12%, ferritin normal at 19, although low.   She has a history of low hemoglobin levels, with recent lab results indicating low iron  and B12 levels. She has been receiving B12 injections for four weeks and iron  supplementation for three weeks, noting improvement in symptoms such as increased energy, better appetite, reduced tingling in her hands and feet, and decreased dizziness upon standing.   She has been taking over-the-counter iron  supplements (65 mg ferrous sulfate, 325 mg) and B vitamins for approximately three years. She also takes magnesium occasionally to aid sleep and a multivitamin. No significant blood loss is reported, though she occasionally notices a small amount of blood on toilet paper. She has not undergone a colonoscopy and has no family history of  colon cancer.   She experiences fatigue and has been prescribed tranexamic acid by her gynecologist to manage potentially heavy menstrual cycles, which she has been taking for about a year. She reports feeling winded easily, though this has improved over the past month. No chest pain, regular headaches, or vision problems.   She has a history of Meniere's disease, with symptoms including vertigo and hearing loss in her left ear, but no significant tinnitus. She experiences a 'whooshing' sound in her ears, akin to her heartbeat. She has undergone surgeries including C-sections, carpal tunnel release on the right side, ankle ligament repair, and rotator cuff surgery.   She denies smoking and reports infrequent alcohol consumption. She has no family history of Meniere's disease.  Chronic iron  deficiency anemia likely due to menstrual blood loss, with low-normal ferritin and slightly low iron  saturation.    Vitamin B12 deficiency improved with injections, alleviating symptoms such as appetite loss, fatigue, paresthesia, and dizziness. No gastrointestinal blood loss or family history of colon cancer.    On her consultation with us  on 10/12/2024, labs showed stable hemoglobin of 13.7, hematocrit 41.8, MCV normal at 86.7.  White count and platelet count were within normal limits.  Iron  studies showed no evidence of iron  deficiency.  Methylmalonic acid level was normal at 124 nmol/L.  Anti-intrinsic factor antibody and antiparietal cell antibodies were both negative, ruling out pernicious anemia.   Grossly negative hematological workup.  She was advised to continue oral iron  supplements once daily and B12 injections monthly.    RTC in 4 months for follow-up with repeat labs.  REVIEW OF SYSTEMS:    Review of Systems - Oncology  All other pertinent systems were reviewed with the patient and are negative.  I have reviewed the past medical history, past surgical history, social history and family  history with the patient and they are unchanged from previous note.  ALLERGIES:  She is allergic to codeine and gramineae pollens.  MEDICATIONS:  Current Outpatient Medications  Medication Sig Dispense Refill   b complex vitamins capsule Take 1 capsule by mouth daily.     diazepam (VALIUM) 5 MG tablet Take 5 mg by mouth as needed.     ferrous sulfate 324 MG TBEC Take 324 mg by mouth.     Multiple Vitamin (MULTIVITAMIN WITH MINERALS) TABS tablet Take 1 tablet by mouth daily.     tranexamic acid (LYSTEDA) 650 MG TABS tablet Take 1,300 mg by mouth 3 (three) times daily.     traZODone (DESYREL) 50 MG tablet Take 50 mg by mouth at bedtime as needed for sleep.     No current facility-administered medications for this visit.    PHYSICAL EXAMINATION:  Not performed today as it was a phone only visit  LABORATORY DATA:   I have reviewed the data as listed.  Recent Results (from the past 2160 hours)  CBC with Differential/Platelet     Status: None   Collection Time: 09/15/24  9:13 AM  Result Value Ref Range   WBC 5.7 4.0 - 10.5 K/uL   RBC 4.74 3.87 - 5.11 Mil/uL   Hemoglobin 13.4 12.0 - 15.0 g/dL   HCT 59.6 63.9 - 53.9 %   MCV 84.9 78.0 - 100.0 fl   MCHC 33.2 30.0 - 36.0 g/dL   RDW 86.9 88.4 - 84.4 %   Platelets 195.0 150.0 - 400.0 K/uL   Neutrophils Relative % 70.1 43.0 - 77.0 %   Lymphocytes Relative 21.2 12.0 - 46.0 %   Monocytes Relative 6.3 3.0 - 12.0 %   Eosinophils Relative 2.0 0.0 - 5.0 %   Basophils Relative 0.4 0.0 - 3.0 %   Neutro Abs 4.0 1.4 - 7.7 K/uL   Lymphs Abs 1.2 0.7 - 4.0 K/uL   Monocytes Absolute 0.4 0.1 - 1.0 K/uL   Eosinophils Absolute 0.1 0.0 - 0.7 K/uL   Basophils Absolute 0.0 0.0 - 0.1 K/uL  Comprehensive metabolic panel with GFR     Status: None   Collection Time: 09/15/24  9:13 AM  Result Value Ref Range   Sodium 135 135 - 145 mEq/L   Potassium 4.1 3.5 - 5.1 mEq/L   Chloride 108 96 - 112 mEq/L   CO2 26 19 - 32 mEq/L   Glucose, Bld 97 70 - 99 mg/dL    BUN 12 6 - 23 mg/dL   Creatinine, Ser 9.09 0.40 - 1.20 mg/dL   Total Bilirubin 0.5 0.2 - 1.2 mg/dL   Alkaline Phosphatase 49 39 - 117 U/L   AST 10 0 - 37 U/L   ALT 7 0 - 35 U/L   Total Protein 6.7 6.0 - 8.3 g/dL   Albumin 4.3 3.5 - 5.2 g/dL   GFR 20.71 >39.99 mL/min    Comment: Calculated using the CKD-EPI Creatinine Equation (2021)   Calcium 9.1 8.4 - 10.5 mg/dL  Hemoglobin J8r     Status: None   Collection Time: 09/15/24  9:13 AM  Result Value Ref Range   Hgb A1c MFr Bld 5.7 4.6 - 6.5 %    Comment: Glycemic Control Guidelines for People with  Diabetes:Non Diabetic:  <6%Goal of Therapy: <7%Additional Action Suggested:  >8%   TSH     Status: None   Collection Time: 09/15/24  9:13 AM  Result Value Ref Range   TSH 1.21 0.35 - 5.50 uIU/mL  IBC + Ferritin     Status: Abnormal   Collection Time: 09/15/24  9:13 AM  Result Value Ref Range   Iron  45 42 - 145 ug/dL   Transferrin 739.9 787.9 - 360.0 mg/dL   Saturation Ratios 87.5 (L) 20.0 - 50.0 %   Ferritin 19.4 10.0 - 291.0 ng/mL   TIBC 364.0 250.0 - 450.0 mcg/dL  IFE, PE and FLC, Serum     Status: None   Collection Time: 09/15/24  9:13 AM  Result Value Ref Range   IgG (Immunoglobin G), Serum 1,118 586 - 1,602 mg/dL   Immunoglobulin A, (IgA) QN, Serum 157 87 - 352 mg/dL   IgM (Immunoglobulin M), Srm 72 26 - 217 mg/dL   Total Protein 6.4 6.0 - 8.5 g/dL   Albumin SerPl Elph-Mcnc 3.5 2.9 - 4.4 g/dL   Alpha 1 0.2 0.0 - 0.4 g/dL   Alpha2 Glob SerPl Elph-Mcnc 0.7 0.4 - 1.0 g/dL   B-Globulin SerPl Elph-Mcnc 1.0 0.7 - 1.3 g/dL   Gamma Glob SerPl Elph-Mcnc 1.1 0.4 - 1.8 g/dL   M Protein SerPl Elph-Mcnc Not Observed Not Observed g/dL   Globulin, Total 2.9 2.2 - 3.9 g/dL   Albumin/Glob SerPl 1.3 0.7 - 1.7   IFE 1 Comment     Comment: The immunofixation pattern appears unremarkable. Evidence of monoclonal protein is not apparent.    Please Note Comment     Comment: Protein electrophoresis scan will follow via computer, mail, or courier  delivery.    Ig Kappa Free Light Chain 17.4 3.3 - 19.4 mg/L   Ig Lambda Free Light Chain 17.6 5.7 - 26.3 mg/L   KAPPA/LAMBDA RATIO 0.99 0.26 - 1.65  Vitamin B12     Status: Abnormal   Collection Time: 09/15/24  9:13 AM  Result Value Ref Range   Vitamin B-12 193 (L) 211 - 911 pg/mL  17-Hydroxyprogesterone     Status: None   Collection Time: 09/15/24  9:13 AM  Result Value Ref Range   17-OH-Progesterone, LC/MS/MS 104  ng/dL    Comment: . * Unable to flag abnormal result(s), please refer     to reference range(s) below: . Adult Female Reference Ranges   for 17-Hydroxyprogesterone: .   Pre-Menopausal Mid Follicular:  23 - 102 ng/dL   Pre-Menopausal Surge:           67 - 349 ng/dL   Pre-Menopausal Mid Luteal:     139 - 431 ng/dL   Postmenopausal Phase:          < or = 45 ng/dL .       Female Tanner Stages: .   II - III Females:  18 - 220 ng/dL   IV - V   Females:  36 - 200 ng/dL . SABRA **Includes data from J Clin Endocrinol Metab.   [phone]; J Clin Endocrinol Metab.   [phone]; J Clin Endocrinol Metab.   1994;78:226-270. Pediatr Res 1988;23:525-529.   MedLinePlus (accessed 06/15/13). . This test was developed and its analytical performance characteristics have been determined by [facility] [place], [place]. It has not been cleared or approved by the U.S. Food and Drug Administration. This assay has been validated pursuant to the CLIA regulations and is us  ed for clinical  purposes. .   T4, free     Status: None   Collection Time: 09/15/24  9:13 AM  Result Value Ref Range   Free T4 1.02 0.60 - 1.60 ng/dL    Comment: Specimens from patients who are undergoing biotin therapy and /or ingesting biotin supplements may contain high levels of biotin.  The higher biotin concentration in these specimens interferes with this Free T4 assay.  Specimens that contain high levels  of biotin may cause false high results for this Free T4 assay.  Please  interpret results in light of the total clinical presentation of the patient.    POCT urine pregnancy     Status: None   Collection Time: 09/15/24  9:34 AM  Result Value Ref Range   Preg Test, Ur Negative Negative  Anti-parietal antibody     Status: None   Collection Time: 10/12/24 10:33 AM  Result Value Ref Range   Parietal Cell Antibody-IgG 0.8 0.0 - 20.0 Units    Comment: (NOTE)                                Negative    0.0 - 20.0                                Equivocal  20.1 - 24.9                                Positive         >24.9 Parietal Cell Antibodies are found in 90% of patients with pernicious anemia and 30% of first degree relatives with pernicious anemia. Performed At: [facility] [address] [place], [place] [id number] Jennette Shorter MD Ey:[id number]   Intrinsic Factor Antibodies     Status: None   Collection Time: 10/12/24 10:33 AM  Result Value Ref Range   Intrinsic Factor 1.1 0.0 - 1.1 AU/mL    Comment: (NOTE) Performed At: [facility] [address] [place], [place] [id number] Jennette Shorter MD Ey:[id number]   Methylmalonic acid, serum     Status: None   Collection Time: 10/12/24 10:33 AM  Result Value Ref Range   Methylmalonic Acid, Quantitative 124 0 - 378 nmol/L    Comment: (NOTE) This test was developed and its performance characteristics determined by [facility]. It has not been cleared or approved by the Food and Drug Administration. Performed At: [facility] [address] [place], [place] [id number] Jennette Shorter MD Ey:[id number]   Iron  and TIBC     Status: None   Collection Time: 10/12/24 10:33 AM  Result Value Ref Range   Iron  56 28 - 170 ug/dL   TIBC 684 749 - 549 ug/dL   Saturation Ratios 18 10.4 - 31.8 %   UIBC 259 ug/dL    Comment: Performed at [facility], [address], [place], [place] [address]  CBC with Differential (Cancer Center Only)     Status: None   Collection Time: 10/12/24 10:33 AM   Result Value Ref Range   WBC Count 5.7 4.0 - 10.5 K/uL   RBC 4.82 3.87 - 5.11 MIL/uL   Hemoglobin 13.7 12.0 - 15.0 g/dL   HCT 58.1 63.9 - 53.9 %   MCV 86.7 80.0 - 100.0 fL   MCH 28.4 26.0 - 34.0 pg  MCHC 32.8 30.0 - 36.0 g/dL   RDW 86.9 88.4 - 84.4 %   Platelet Count 192 150 - 400 K/uL   nRBC 0.0 0.0 - 0.2 %   Neutrophils Relative % 66 %   Neutro Abs 3.8 1.7 - 7.7 K/uL   Lymphocytes Relative 25 %   Lymphs Abs 1.4 0.7 - 4.0 K/uL   Monocytes Relative 5 %   Monocytes Absolute 0.3 0.1 - 1.0 K/uL   Eosinophils Relative 3 %   Eosinophils Absolute 0.1 0.0 - 0.5 K/uL   Basophils Relative 1 %   Basophils Absolute 0.0 0.0 - 0.1 K/uL   Immature Granulocytes 0 %   Abs Immature Granulocytes 0.01 0.00 - 0.07 K/uL    Comment: Performed at [facility], [address], [place], [place] [address]  Folate     Status: None   Collection Time: 10/12/24 10:33 AM  Result Value Ref Range   Folate 6.4 >5.9 ng/mL    Comment: Performed at [facility], [address] [place], [place] [address]  Ferritin     Status: None   Collection Time: 10/12/24 10:34 AM  Result Value Ref Range   Ferritin 265 11 - 307 ng/mL    Comment: Performed at [facility], [address], [place], [place] [address]     RADIOGRAPHIC STUDIES: No recent pertinent imaging studies available to review.  No orders of the defined types were placed in this encounter.    Future Appointments  Date Time Provider Department Center  12/04/2024  9:00 AM [facility] CLINICAL SUPPORT [facility] [place]  01/04/2025  8:30 AM [facility] CLINICAL SUPPORT [facility] [place]  01/13/2025  9:40 AM Jerrell Cleatus Ned, MD [facility] [place]  02/04/2025  8:30 AM [facility] CLINICAL SU[facility] [place]  02/15/2025 11:15 AM [facility] PHLEBOTOMIST [facility] None  02/15/2025 11:45 AM Nile Prisk, Chinita, MD [facility] None  03/04/2025  8:30 AM [facility] CLINICAL SU[facility] [place]    This document was  completed utilizing speech recognition software. Grammatical errors, random word insertions, pronoun errors, and incomplete sentences are an occasional consequence of this system due to software limitations, ambient noise, and hardware issues. Any formal questions or concerns about the content, text or information contained within the body of this dictation should be directly addressed to the provider for clarification.

## 2024-12-04 ENCOUNTER — Ambulatory Visit

## 2024-12-04 DIAGNOSIS — E538 Deficiency of other specified B group vitamins: Secondary | ICD-10-CM | POA: Diagnosis not present

## 2024-12-04 MED ORDER — CYANOCOBALAMIN 1000 MCG/ML IJ SOLN
1000.0000 ug | INTRAMUSCULAR | Status: AC
  Administered 2024-12-04 – 2025-02-04 (×3): 1000 ug via INTRAMUSCULAR

## 2024-12-04 NOTE — Progress Notes (Signed)
 Patient is in office today for a nurse visit for B12 Injection. Patient Injection was given in the  Right deltoid. Patient tolerated injection well.

## 2025-01-04 ENCOUNTER — Ambulatory Visit (INDEPENDENT_AMBULATORY_CARE_PROVIDER_SITE_OTHER)

## 2025-01-04 DIAGNOSIS — E538 Deficiency of other specified B group vitamins: Secondary | ICD-10-CM

## 2025-01-04 NOTE — Progress Notes (Signed)
 Diana Macdonald is a 44 y.o. female presents in office today for a nurse visit for B12 Injection.   Patient Injection was given in the  Right deltoid. Patient tolerated injection well.   Patient's next injection due 02/04/2025, appt made? yes  [facility]

## 2025-01-13 ENCOUNTER — Ambulatory Visit (INDEPENDENT_AMBULATORY_CARE_PROVIDER_SITE_OTHER): Admitting: Student in an Organized Health Care Education/Training Program

## 2025-01-13 ENCOUNTER — Encounter: Payer: Self-pay | Admitting: Student in an Organized Health Care Education/Training Program

## 2025-01-13 VITALS — BP 95/71 | HR 70 | Temp 98.2°F | Ht 64.0 in | Wt 169.2 lb

## 2025-01-13 DIAGNOSIS — R634 Abnormal weight loss: Secondary | ICD-10-CM | POA: Diagnosis not present

## 2025-01-13 DIAGNOSIS — Z114 Encounter for screening for human immunodeficiency virus [HIV]: Secondary | ICD-10-CM | POA: Diagnosis not present

## 2025-01-13 DIAGNOSIS — Z1159 Encounter for screening for other viral diseases: Secondary | ICD-10-CM

## 2025-01-13 DIAGNOSIS — Z0000 Encounter for general adult medical examination without abnormal findings: Secondary | ICD-10-CM | POA: Insufficient documentation

## 2025-01-13 DIAGNOSIS — E538 Deficiency of other specified B group vitamins: Secondary | ICD-10-CM

## 2025-01-13 DIAGNOSIS — E611 Iron deficiency: Secondary | ICD-10-CM

## 2025-01-13 DIAGNOSIS — Z13220 Encounter for screening for lipoid disorders: Secondary | ICD-10-CM

## 2025-01-13 LAB — LIPID PANEL
Cholesterol: 178 mg/dL (ref 28–200)
HDL: 62.4 mg/dL
LDL Cholesterol: 107 mg/dL — ABNORMAL HIGH (ref 10–99)
NonHDL: 115.73
Total CHOL/HDL Ratio: 3
Triglycerides: 42 mg/dL (ref 10.0–149.0)
VLDL: 8.4 mg/dL (ref 0.0–40.0)

## 2025-01-13 LAB — IBC + FERRITIN
Ferritin: 37.2 ng/mL (ref 10.0–291.0)
Iron: 70 ug/dL (ref 42–145)
Saturation Ratios: 20.2 % (ref 20.0–50.0)
TIBC: 347.2 ug/dL (ref 250.0–450.0)
Transferrin: 248 mg/dL (ref 212.0–360.0)

## 2025-01-13 LAB — CBC
HCT: 40.3 % (ref 36.0–46.0)
Hemoglobin: 13.5 g/dL (ref 12.0–15.0)
MCHC: 33.5 g/dL (ref 30.0–36.0)
MCV: 86.7 fl (ref 78.0–100.0)
Platelets: 175 K/uL (ref 150.0–400.0)
RBC: 4.65 Mil/uL (ref 3.87–5.11)
RDW: 13.2 % (ref 11.5–15.5)
WBC: 5.3 K/uL (ref 4.0–10.5)

## 2025-01-13 LAB — VITAMIN B12: Vitamin B-12: 379 pg/mL (ref 211–911)

## 2025-01-13 NOTE — Progress Notes (Signed)
 "  Established Patient Office Visit  Patient ID: Diana Macdonald, female    DOB: 06/18/1981  Age: 44 y.o. MRN: [id number] PCP: Jerrell Diana Ned, MD  Chief Complaint  Patient presents with   Follow-up    Patient states follow up from starting infusions vitamins,.    Subjective:     HPI  Discussed the use of AI scribe software for clinical note transcription with the patient, who gave verbal consent to proceed.  History of Present Illness Diana Macdonald is a 44 year old female with vitamin B12 deficiency who presents for follow-up on her treatment and symptoms.  B12 injections have been beneficial, particularly in the first month, reducing symptoms such as dizziness upon standing and nocturnal paresthesia in her arms. However, the effects have diminished over time, with less duration of symptom relief as the months progress.  She experiences anxiety, particularly before her B12 injections or when fatigued, described as a 'pit in her stomach' feeling. These symptoms correlate with her injection schedule and sleep patterns. No depressed mood, but anxiety fluctuates, often related to her sleep quality.  Her appetite has improved, and she no longer feels as 'crappy.' Hair loss has decreased, although regrowth has not been observed. Family members have noticed an increase in her energy levels, allowing her to engage more in daily activities without procrastination, especially in the days following her B12 injections.  She continues to take tranexamic acid during her menstrual periods, which she describes as 'not that bad.' She also takes oral iron  tablets daily, along with a multivitamin and B12 supplement. Her diet is varied, and she feels she eats well.  She mentions a recent trip to [place]  where she engaged in paddleboarding, indicating some level of physical activity. She generally requires a significant amount of sleep, approximately nine hours per night, to feel  rested and function well.     Objective:     BP 95/71   Pulse 70   Temp 98.2 F (36.8 C) (Oral)   Ht 5' 4 (1.626 m)   Wt 169 lb 3.2 oz (76.7 kg)   SpO2 99%   BMI 29.04 kg/m   Physical Exam  Gen: Well-appearing woman Neck: Normal thyroid, no nodules or adenopathy Heart: Regular, no murmur Lungs: Unlabored, clear throughout Ext: Warm, no edema, normal joints    Assessment & Plan:   Problem List Items Addressed This Visit       High   Iron  deficiency - Primary (Chronic)   Chronic and stable issue, due to menorrhagia which is being treated with tranexamic acid during menstrual bleeding, and reports that her blood loss has been much improved.  Eating a good diet.  Completed iron  infusions.  Currently using 1 oral iron  supplement daily without GI side effects.  Will recheck iron  levels today.      Relevant Orders   CBC   IBC + Ferritin   Unintentional weight loss (Chronic)   This issue seems to have stabilized.  Over the last summer into fall she had a 20 pound unintentional weight loss.  Our workup for the GI malabsorption issue was reassuring.  Weight has currently stabilized at 169 pounds with BMI of 29.  She is very functional.  I think some of her weight loss may have been related to iron  and B12 deficiencies which was causing her a lot of issues with functioning and fatigue.        Medium    B12 deficiency (Chronic)   B12 injections  have improved her symptoms, but the effects diminish over time. She experiences increased energy and mood post-injection, though anxiety persists, possibly related to fatigue or sleep patterns. B12 levels were checked along with methylmalonic acid.  She is at about the halfway mark of her 1 month cycle so I think this is a good time to check B12 to look at average.  Intrinsic factor was normal, antiparietal cell antibody tests were normal.  Continue monthly B12 injections, okay with me if the patient wants to transition to doing these  injections herself at home.  She is comfortable with this.  I also offered her the chance to switch to an oral B12, because the intrinsic factor was normal I think she would have a good chance of absorbing it okay.  She is hesitant to make any change right now.      Relevant Orders   Vitamin B12   Methylmalonic Acid     Unprioritized   Health maintenance examination   Relevant Orders   GeneConnect Molecular Screen - Blood   Other Visit Diagnoses       Screening for lipid disorders       Relevant Orders   Lipid panel     Encounter for HCV screening test for low risk patient       Relevant Orders   Hepatitis C antibody     Screening for HIV (human immunodeficiency virus)       Relevant Orders   HIV Antibody (routine testing w rflx)       Return in about 6 months (around 07/13/2025).    Diana Debby Specking, MD [place]  [facility]   "

## 2025-01-13 NOTE — Assessment & Plan Note (Signed)
 This issue seems to have stabilized.  Over the last summer into fall she had a 20 pound unintentional weight loss.  Our workup for the GI malabsorption issue was reassuring.  Weight has currently stabilized at 169 pounds with BMI of 29.  She is very functional.  I think some of her weight loss may have been related to iron  and B12 deficiencies which was causing her a lot of issues with functioning and fatigue.

## 2025-01-13 NOTE — Patient Instructions (Signed)
" °  VISIT SUMMARY: During your visit, we discussed your ongoing treatment for vitamin B12 deficiency, iron  deficiency, and anxiety. We reviewed your symptoms and the effectiveness of your current treatments. We also addressed general health maintenance, including administering your flu shot.  YOUR PLAN: -B12 DEFICIENCY: Vitamin B12 deficiency can cause symptoms like dizziness, fatigue, and numbness. Your B12 injections have been helpful, but their effects are diminishing over time. We will continue with monthly B12 injections and discuss the possibility of transitioning to oral B12 supplements in the future.  -IRON  DEFICIENCY: Iron  deficiency can lead to fatigue and other symptoms. Your oral iron  tablets and tranexamic acid during menstrual periods are effectively managing your iron  levels. We will continue with this treatment plan.  -ANXIETY: Anxiety can cause feelings of unease and worry, often related to sleep and fatigue. We will monitor your symptoms and discuss medication options if your anxiety becomes unmanageable.  -GENERAL HEALTH MAINTENANCE: You received your flu shot today. We also discussed the possibility of you self-administering your B12 injections in the future.  INSTRUCTIONS: Please continue with your monthly B12 injections and daily oral iron  tablets. Use tranexamic acid during your menstrual periods as needed. Monitor your anxiety symptoms and report any worsening. We will discuss transitioning to oral B12 supplements and the possibility of self-administering your B12 injections at your next visit.   "

## 2025-01-13 NOTE — Assessment & Plan Note (Signed)
 B12 injections have improved her symptoms, but the effects diminish over time. She experiences increased energy and mood post-injection, though anxiety persists, possibly related to fatigue or sleep patterns. B12 levels were checked along with methylmalonic acid.  She is at about the halfway mark of her 1 month cycle so I think this is a good time to check B12 to look at average.  Intrinsic factor was normal, antiparietal cell antibody tests were normal.  Continue monthly B12 injections, okay with me if the patient wants to transition to doing these injections herself at home.  She is comfortable with this.  I also offered her the chance to switch to an oral B12, because the intrinsic factor was normal I think she would have a good chance of absorbing it okay.  She is hesitant to make any change right now.

## 2025-01-13 NOTE — Assessment & Plan Note (Signed)
 Chronic and stable issue, due to menorrhagia which is being treated with tranexamic acid during menstrual bleeding, and reports that her blood loss has been much improved.  Eating a good diet.  Completed iron  infusions.  Currently using 1 oral iron  supplement daily without GI side effects.  Will recheck iron  levels today.

## 2025-01-14 ENCOUNTER — Ambulatory Visit: Payer: Self-pay | Admitting: Student in an Organized Health Care Education/Training Program

## 2025-01-16 LAB — HIV ANTIBODY (ROUTINE TESTING W REFLEX)
HIV 1&2 Ab, 4th Generation: NONREACTIVE
HIV FINAL INTERPRETATION: NEGATIVE

## 2025-01-16 LAB — HEPATITIS C ANTIBODY: Hepatitis C Ab: NONREACTIVE

## 2025-01-16 LAB — METHYLMALONIC ACID, SERUM: Methylmalonic Acid, Quant: 133 nmol/L (ref 55–335)

## 2025-01-19 ENCOUNTER — Other Ambulatory Visit: Payer: Self-pay | Admitting: Medical Genetics

## 2025-01-19 DIAGNOSIS — Z006 Encounter for examination for normal comparison and control in clinical research program: Secondary | ICD-10-CM

## 2025-01-29 MED ORDER — SERTRALINE HCL 50 MG PO TABS: 50.0000 mg | ORAL_TABLET | Freq: Every day | ORAL | 3 refills | Status: DC

## 2025-01-29 NOTE — Telephone Encounter (Signed)
 Patient responded back and notes she is willing to try the Zoloft  as recommended has taken Ambien in the past for sleep disturbance

## 2025-01-29 NOTE — Telephone Encounter (Signed)
 Patient requesting medication for anxiety or for sleep. Notes she doesn't want to wait for follow up in a few months should patient make a separate appt ?

## 2025-01-29 NOTE — Addendum Note (Signed)
 Addended by: JERRELL SOLIAN T on: 01/29/2025 03:47 PM   Modules accepted: Orders

## 2025-02-01 MED ORDER — SERTRALINE HCL 50 MG PO TABS: 50.0000 mg | ORAL_TABLET | Freq: Every day | ORAL | 3 refills | Status: AC

## 2025-02-01 NOTE — Addendum Note (Signed)
 Addended by: Karlyn Glasco K on: 02/01/2025 12:31 PM   Modules accepted: Orders

## 2025-02-04 ENCOUNTER — Ambulatory Visit

## 2025-02-04 NOTE — Progress Notes (Signed)
 Patient is in office today for a nurse visit for B12 Injection. Patient Injection was given in the  Left deltoid. Patient tolerated injection well.

## 2025-02-15 ENCOUNTER — Inpatient Hospital Stay

## 2025-02-15 ENCOUNTER — Inpatient Hospital Stay: Admitting: Oncology

## 2025-03-04 ENCOUNTER — Ambulatory Visit
